# Patient Record
Sex: Female | Born: 1945 | Race: Black or African American | Hispanic: No | Marital: Married | Smoking: Former smoker
Health system: Southern US, Community
[De-identification: ages and names within clinical notes are randomized; demographics above are authoritative.]

## PROBLEM LIST (undated history)

## (undated) DIAGNOSIS — IMO0002 Reserved for concepts with insufficient information to code with codable children: Secondary | ICD-10-CM

## (undated) DIAGNOSIS — T7840XA Allergy, unspecified, initial encounter: Secondary | ICD-10-CM

## (undated) DIAGNOSIS — J439 Emphysema, unspecified: Secondary | ICD-10-CM

## (undated) DIAGNOSIS — K219 Gastro-esophageal reflux disease without esophagitis: Secondary | ICD-10-CM

## (undated) DIAGNOSIS — E119 Type 2 diabetes mellitus without complications: Secondary | ICD-10-CM

## (undated) DIAGNOSIS — C801 Malignant (primary) neoplasm, unspecified: Secondary | ICD-10-CM

## (undated) HISTORY — PX: LUNG SURGERY: SHX703

## (undated) HISTORY — DX: Allergy, unspecified, initial encounter: T78.40XA

## (undated) HISTORY — DX: Emphysema, unspecified: J43.9

## (undated) HISTORY — DX: Reserved for concepts with insufficient information to code with codable children: IMO0002

## (undated) HISTORY — DX: Type 2 diabetes mellitus without complications: E11.9

## (undated) HISTORY — DX: Malignant (primary) neoplasm, unspecified: C80.1

## (undated) HISTORY — DX: Gastro-esophageal reflux disease without esophagitis: K21.9

---

## 1998-08-27 ENCOUNTER — Other Ambulatory Visit: Admission: RE | Admit: 1998-08-27 | Discharge: 1998-08-27 | Payer: Self-pay | Admitting: Obstetrics and Gynecology

## 1999-10-04 ENCOUNTER — Other Ambulatory Visit: Admission: RE | Admit: 1999-10-04 | Discharge: 1999-10-04 | Payer: Self-pay | Admitting: Obstetrics and Gynecology

## 2000-09-16 ENCOUNTER — Emergency Department (HOSPITAL_COMMUNITY): Admission: EM | Admit: 2000-09-16 | Discharge: 2000-09-17 | Payer: Self-pay | Admitting: Emergency Medicine

## 2000-10-20 ENCOUNTER — Other Ambulatory Visit: Admission: RE | Admit: 2000-10-20 | Discharge: 2000-10-20 | Payer: Self-pay | Admitting: Obstetrics and Gynecology

## 2001-11-04 ENCOUNTER — Other Ambulatory Visit: Admission: RE | Admit: 2001-11-04 | Discharge: 2001-11-04 | Payer: Self-pay | Admitting: Obstetrics and Gynecology

## 2002-11-08 ENCOUNTER — Other Ambulatory Visit: Admission: RE | Admit: 2002-11-08 | Discharge: 2002-11-08 | Payer: Self-pay | Admitting: Obstetrics and Gynecology

## 2003-08-17 ENCOUNTER — Encounter: Admission: RE | Admit: 2003-08-17 | Discharge: 2003-08-17 | Payer: Self-pay | Admitting: Internal Medicine

## 2003-11-28 ENCOUNTER — Other Ambulatory Visit: Admission: RE | Admit: 2003-11-28 | Discharge: 2003-11-28 | Payer: Self-pay | Admitting: Obstetrics and Gynecology

## 2007-03-03 ENCOUNTER — Ambulatory Visit (HOSPITAL_COMMUNITY): Admission: RE | Admit: 2007-03-03 | Discharge: 2007-03-03 | Payer: Self-pay | Admitting: Internal Medicine

## 2007-10-04 ENCOUNTER — Encounter: Admission: RE | Admit: 2007-10-04 | Discharge: 2007-10-04 | Payer: Self-pay | Admitting: Internal Medicine

## 2007-10-12 ENCOUNTER — Ambulatory Visit (HOSPITAL_COMMUNITY): Admission: RE | Admit: 2007-10-12 | Discharge: 2007-10-12 | Payer: Self-pay | Admitting: Internal Medicine

## 2007-10-14 ENCOUNTER — Ambulatory Visit: Payer: Self-pay | Admitting: Thoracic Surgery

## 2007-10-18 ENCOUNTER — Ambulatory Visit (HOSPITAL_COMMUNITY): Admission: RE | Admit: 2007-10-18 | Discharge: 2007-10-18 | Payer: Self-pay | Admitting: Thoracic Surgery

## 2007-10-21 ENCOUNTER — Inpatient Hospital Stay (HOSPITAL_COMMUNITY): Admission: RE | Admit: 2007-10-21 | Discharge: 2007-10-28 | Payer: Self-pay | Admitting: Thoracic Surgery

## 2007-10-21 ENCOUNTER — Encounter: Payer: Self-pay | Admitting: Thoracic Surgery

## 2007-10-21 ENCOUNTER — Ambulatory Visit: Payer: Self-pay | Admitting: Internal Medicine

## 2007-10-22 ENCOUNTER — Ambulatory Visit: Payer: Self-pay | Admitting: Thoracic Surgery

## 2007-10-27 ENCOUNTER — Ambulatory Visit: Payer: Self-pay | Admitting: Internal Medicine

## 2007-11-03 ENCOUNTER — Encounter: Admission: RE | Admit: 2007-11-03 | Discharge: 2007-11-03 | Payer: Self-pay | Admitting: Thoracic Surgery

## 2007-11-03 ENCOUNTER — Ambulatory Visit: Payer: Self-pay | Admitting: Thoracic Surgery

## 2007-11-08 ENCOUNTER — Ambulatory Visit: Payer: Self-pay | Admitting: Thoracic Surgery

## 2007-11-10 LAB — CBC WITH DIFFERENTIAL/PLATELET
BASO%: 0.3 % (ref 0.0–2.0)
HCT: 34.9 % (ref 34.8–46.6)
MCHC: 34.1 g/dL (ref 32.0–36.0)
MONO#: 0.4 10*3/uL (ref 0.1–0.9)
RBC: 4.16 10*6/uL (ref 3.70–5.32)
RDW: 14.5 % (ref 11.3–14.5)
WBC: 7.5 10*3/uL (ref 3.9–10.0)
lymph#: 2 10*3/uL (ref 0.9–3.3)

## 2007-11-10 LAB — COMPREHENSIVE METABOLIC PANEL
ALT: 28 U/L (ref 0–35)
AST: 30 U/L (ref 0–37)
CO2: 25 mEq/L (ref 19–32)
Calcium: 9.4 mg/dL (ref 8.4–10.5)
Chloride: 99 mEq/L (ref 96–112)
Potassium: 4.1 mEq/L (ref 3.5–5.3)
Sodium: 140 mEq/L (ref 135–145)
Total Protein: 7.2 g/dL (ref 6.0–8.3)

## 2007-11-17 ENCOUNTER — Encounter: Admission: RE | Admit: 2007-11-17 | Discharge: 2007-11-17 | Payer: Self-pay | Admitting: Thoracic Surgery

## 2007-11-17 ENCOUNTER — Ambulatory Visit: Payer: Self-pay | Admitting: Thoracic Surgery

## 2007-11-18 LAB — CBC WITH DIFFERENTIAL/PLATELET
BASO%: 0.2 % (ref 0.0–2.0)
LYMPH%: 8.5 % — ABNORMAL LOW (ref 14.0–48.0)
MCHC: 32.6 g/dL (ref 32.0–36.0)
MONO#: 0.5 10*3/uL (ref 0.1–0.9)
RBC: 4.29 10*6/uL (ref 3.70–5.32)
RDW: 13.7 % (ref 11.3–14.5)
WBC: 16.3 10*3/uL — ABNORMAL HIGH (ref 3.9–10.0)
lymph#: 1.4 10*3/uL (ref 0.9–3.3)

## 2007-11-18 LAB — COMPREHENSIVE METABOLIC PANEL
ALT: 17 U/L (ref 0–35)
CO2: 22 mEq/L (ref 19–32)
Calcium: 10.3 mg/dL (ref 8.4–10.5)
Chloride: 99 mEq/L (ref 96–112)
Glucose, Bld: 242 mg/dL — ABNORMAL HIGH (ref 70–99)
Sodium: 138 mEq/L (ref 135–145)
Total Protein: 7.8 g/dL (ref 6.0–8.3)

## 2007-12-02 LAB — CBC WITH DIFFERENTIAL/PLATELET
Eosinophils Absolute: 0 10*3/uL (ref 0.0–0.5)
HCT: 31.5 % — ABNORMAL LOW (ref 34.8–46.6)
LYMPH%: 13.5 % — ABNORMAL LOW (ref 14.0–48.0)
MCHC: 34.1 g/dL (ref 32.0–36.0)
MONO#: 0.3 10*3/uL (ref 0.1–0.9)
NEUT#: 13.6 10*3/uL — ABNORMAL HIGH (ref 1.5–6.5)
NEUT%: 84.6 % — ABNORMAL HIGH (ref 39.6–76.8)
Platelets: 189 10*3/uL (ref 145–400)
WBC: 16.1 10*3/uL — ABNORMAL HIGH (ref 3.9–10.0)

## 2007-12-02 LAB — MAGNESIUM: Magnesium: 1.3 mg/dL — ABNORMAL LOW (ref 1.5–2.5)

## 2007-12-02 LAB — COMPREHENSIVE METABOLIC PANEL
CO2: 25 mEq/L (ref 19–32)
Creatinine, Ser: 1.18 mg/dL (ref 0.40–1.20)
Glucose, Bld: 271 mg/dL — ABNORMAL HIGH (ref 70–99)
Total Bilirubin: 0.3 mg/dL (ref 0.3–1.2)

## 2007-12-07 ENCOUNTER — Ambulatory Visit: Payer: Self-pay | Admitting: Internal Medicine

## 2007-12-08 ENCOUNTER — Encounter: Admission: RE | Admit: 2007-12-08 | Discharge: 2007-12-08 | Payer: Self-pay | Admitting: Thoracic Surgery

## 2007-12-08 ENCOUNTER — Ambulatory Visit: Payer: Self-pay | Admitting: Thoracic Surgery

## 2007-12-09 LAB — MAGNESIUM: Magnesium: 2.1 mg/dL (ref 1.5–2.5)

## 2007-12-09 LAB — COMPREHENSIVE METABOLIC PANEL
ALT: 13 U/L (ref 0–35)
Albumin: 3.6 g/dL (ref 3.5–5.2)
CO2: 15 mEq/L — ABNORMAL LOW (ref 19–32)
Calcium: 8.4 mg/dL (ref 8.4–10.5)
Chloride: 103 mEq/L (ref 96–112)
Creatinine, Ser: 1.16 mg/dL (ref 0.40–1.20)
Potassium: 4.5 mEq/L (ref 3.5–5.3)

## 2007-12-09 LAB — CBC WITH DIFFERENTIAL/PLATELET
BASO%: 0.2 % (ref 0.0–2.0)
Basophils Absolute: 0 10*3/uL (ref 0.0–0.1)
HCT: 32.6 % — ABNORMAL LOW (ref 34.8–46.6)
HGB: 11.1 g/dL — ABNORMAL LOW (ref 11.6–15.9)
MCHC: 34.1 g/dL (ref 32.0–36.0)
MONO#: 0.9 10*3/uL (ref 0.1–0.9)
NEUT%: 89.2 % — ABNORMAL HIGH (ref 39.6–76.8)
WBC: 23.6 10*3/uL — ABNORMAL HIGH (ref 3.9–10.0)
lymph#: 1.6 10*3/uL (ref 0.9–3.3)

## 2007-12-16 LAB — CBC WITH DIFFERENTIAL/PLATELET
Eosinophils Absolute: 0.1 10*3/uL (ref 0.0–0.5)
HCT: 31.8 % — ABNORMAL LOW (ref 34.8–46.6)
LYMPH%: 40.6 % (ref 14.0–48.0)
MONO#: 0.5 10*3/uL (ref 0.1–0.9)
NEUT#: 2 10*3/uL (ref 1.5–6.5)
Platelets: 117 10*3/uL — ABNORMAL LOW (ref 145–400)
RBC: 3.82 10*6/uL (ref 3.70–5.32)
WBC: 4.4 10*3/uL (ref 3.9–10.0)

## 2007-12-16 LAB — COMPREHENSIVE METABOLIC PANEL
Albumin: 3.8 g/dL (ref 3.5–5.2)
CO2: 24 mEq/L (ref 19–32)
Calcium: 9.1 mg/dL (ref 8.4–10.5)
Glucose, Bld: 175 mg/dL — ABNORMAL HIGH (ref 70–99)
Sodium: 138 mEq/L (ref 135–145)
Total Bilirubin: 0.5 mg/dL (ref 0.3–1.2)
Total Protein: 6.4 g/dL (ref 6.0–8.3)

## 2007-12-16 LAB — MAGNESIUM: Magnesium: 1.3 mg/dL — ABNORMAL LOW (ref 1.5–2.5)

## 2007-12-23 LAB — COMPREHENSIVE METABOLIC PANEL
ALT: 15 U/L (ref 0–35)
AST: 17 U/L (ref 0–37)
BUN: 13 mg/dL (ref 6–23)
CO2: 22 mEq/L (ref 19–32)
Creatinine, Ser: 1.13 mg/dL (ref 0.40–1.20)
Total Bilirubin: 0.3 mg/dL (ref 0.3–1.2)

## 2007-12-23 LAB — CBC WITH DIFFERENTIAL/PLATELET
BASO%: 0.2 % (ref 0.0–2.0)
EOS%: 0 % (ref 0.0–7.0)
HCT: 29.6 % — ABNORMAL LOW (ref 34.8–46.6)
LYMPH%: 19 % (ref 14.0–48.0)
MCH: 29 pg (ref 26.0–34.0)
MCHC: 34.1 g/dL (ref 32.0–36.0)
MCV: 85 fL (ref 81.0–101.0)
NEUT%: 78.6 % — ABNORMAL HIGH (ref 39.6–76.8)
Platelets: 147 10*3/uL (ref 145–400)

## 2007-12-23 LAB — MAGNESIUM: Magnesium: 1.4 mg/dL — ABNORMAL LOW (ref 1.5–2.5)

## 2007-12-30 LAB — CBC WITH DIFFERENTIAL/PLATELET
Basophils Absolute: 0.1 10*3/uL (ref 0.0–0.1)
EOS%: 0 % (ref 0.0–7.0)
Eosinophils Absolute: 0 10*3/uL (ref 0.0–0.5)
HCT: 30.4 % — ABNORMAL LOW (ref 34.8–46.6)
HGB: 9.9 g/dL — ABNORMAL LOW (ref 11.6–15.9)
MCH: 28.7 pg (ref 26.0–34.0)
NEUT#: 17.3 10*3/uL — ABNORMAL HIGH (ref 1.5–6.5)
NEUT%: 91.7 % — ABNORMAL HIGH (ref 39.6–76.8)
lymph#: 1 10*3/uL (ref 0.9–3.3)

## 2007-12-30 LAB — COMPREHENSIVE METABOLIC PANEL
AST: 12 U/L (ref 0–37)
Albumin: 4 g/dL (ref 3.5–5.2)
BUN: 19 mg/dL (ref 6–23)
CO2: 21 mEq/L (ref 19–32)
Calcium: 9.3 mg/dL (ref 8.4–10.5)
Chloride: 100 mEq/L (ref 96–112)
Creatinine, Ser: 1.19 mg/dL (ref 0.40–1.20)
Glucose, Bld: 388 mg/dL — ABNORMAL HIGH (ref 70–99)
Potassium: 4.6 mEq/L (ref 3.5–5.3)

## 2008-01-06 LAB — COMPREHENSIVE METABOLIC PANEL
ALT: 14 U/L (ref 0–35)
AST: 13 U/L (ref 0–37)
Alkaline Phosphatase: 114 U/L (ref 39–117)
Sodium: 136 mEq/L (ref 135–145)
Total Bilirubin: 0.6 mg/dL (ref 0.3–1.2)
Total Protein: 6.4 g/dL (ref 6.0–8.3)

## 2008-01-06 LAB — CBC WITH DIFFERENTIAL/PLATELET
BASO%: 0.3 % (ref 0.0–2.0)
LYMPH%: 52.6 % — ABNORMAL HIGH (ref 14.0–48.0)
MCHC: 34.5 g/dL (ref 32.0–36.0)
MCV: 85.3 fL (ref 81.0–101.0)
MONO%: 8.6 % (ref 0.0–13.0)
Platelets: 117 10*3/uL — ABNORMAL LOW (ref 145–400)
RBC: 3.52 10*6/uL — ABNORMAL LOW (ref 3.70–5.32)
RDW: 17.3 % — ABNORMAL HIGH (ref 11.3–14.5)
WBC: 4.2 10*3/uL (ref 3.9–10.0)

## 2008-01-18 ENCOUNTER — Ambulatory Visit: Payer: Self-pay | Admitting: Internal Medicine

## 2008-01-19 ENCOUNTER — Ambulatory Visit: Payer: Self-pay | Admitting: Thoracic Surgery

## 2008-01-19 ENCOUNTER — Encounter: Admission: RE | Admit: 2008-01-19 | Discharge: 2008-01-19 | Payer: Self-pay | Admitting: Thoracic Surgery

## 2008-01-20 ENCOUNTER — Encounter (HOSPITAL_COMMUNITY): Admission: RE | Admit: 2008-01-20 | Discharge: 2008-04-10 | Payer: Self-pay | Admitting: Internal Medicine

## 2008-01-20 LAB — COMPREHENSIVE METABOLIC PANEL
ALT: 15 U/L (ref 0–35)
BUN: 20 mg/dL (ref 6–23)
CO2: 21 mEq/L (ref 19–32)
Calcium: 9 mg/dL (ref 8.4–10.5)
Chloride: 103 mEq/L (ref 96–112)
Creatinine, Ser: 1.62 mg/dL — ABNORMAL HIGH (ref 0.40–1.20)
Glucose, Bld: 337 mg/dL — ABNORMAL HIGH (ref 70–99)

## 2008-01-20 LAB — CBC WITH DIFFERENTIAL/PLATELET
BASO%: 0.1 % (ref 0.0–2.0)
HCT: 25.3 % — ABNORMAL LOW (ref 34.8–46.6)
MCHC: 33.3 g/dL (ref 32.0–36.0)
MONO#: 0.2 10*3/uL (ref 0.1–0.9)
NEUT%: 92.7 % — ABNORMAL HIGH (ref 39.6–76.8)
WBC: 16.5 10*3/uL — ABNORMAL HIGH (ref 3.9–10.0)
lymph#: 0.9 10*3/uL (ref 0.9–3.3)

## 2008-01-26 ENCOUNTER — Emergency Department (HOSPITAL_COMMUNITY): Admission: EM | Admit: 2008-01-26 | Discharge: 2008-01-26 | Payer: Self-pay | Admitting: Emergency Medicine

## 2008-01-27 LAB — CBC WITH DIFFERENTIAL/PLATELET
Basophils Absolute: 0 10*3/uL (ref 0.0–0.1)
Eosinophils Absolute: 0 10*3/uL (ref 0.0–0.5)
HCT: 38.2 % (ref 34.8–46.6)
HGB: 12.9 g/dL (ref 11.6–15.9)
MCH: 29.2 pg (ref 26.0–34.0)
MONO#: 0.5 10*3/uL (ref 0.1–0.9)
NEUT%: 72.5 % (ref 39.6–76.8)
WBC: 9.4 10*3/uL (ref 3.9–10.0)
lymph#: 2 10*3/uL (ref 0.9–3.3)

## 2008-01-27 LAB — COMPREHENSIVE METABOLIC PANEL
BUN: 32 mg/dL — ABNORMAL HIGH (ref 6–23)
CO2: 23 mEq/L (ref 19–32)
Calcium: 9.5 mg/dL (ref 8.4–10.5)
Chloride: 101 mEq/L (ref 96–112)
Creatinine, Ser: 1.45 mg/dL — ABNORMAL HIGH (ref 0.40–1.20)
Glucose, Bld: 98 mg/dL (ref 70–99)

## 2008-01-27 LAB — MAGNESIUM: Magnesium: 1.1 mg/dL — ABNORMAL LOW (ref 1.5–2.5)

## 2008-02-03 LAB — CBC WITH DIFFERENTIAL/PLATELET
Basophils Absolute: 0.1 10*3/uL (ref 0.0–0.1)
Eosinophils Absolute: 0 10*3/uL (ref 0.0–0.5)
HGB: 11.6 g/dL (ref 11.6–15.9)
MCV: 86.4 fL (ref 81.0–101.0)
MONO#: 0.3 10*3/uL (ref 0.1–0.9)
MONO%: 1.5 % (ref 0.0–13.0)
NEUT#: 13.9 10*3/uL — ABNORMAL HIGH (ref 1.5–6.5)
RBC: 3.96 10*6/uL (ref 3.70–5.32)
RDW: 17.8 % — ABNORMAL HIGH (ref 11.3–14.5)
WBC: 18.1 10*3/uL — ABNORMAL HIGH (ref 3.9–10.0)

## 2008-02-03 LAB — COMPREHENSIVE METABOLIC PANEL
Albumin: 3.5 g/dL (ref 3.5–5.2)
Alkaline Phosphatase: 143 U/L — ABNORMAL HIGH (ref 39–117)
BUN: 18 mg/dL (ref 6–23)
CO2: 27 mEq/L (ref 19–32)
Calcium: 8.9 mg/dL (ref 8.4–10.5)
Chloride: 104 mEq/L (ref 96–112)
Glucose, Bld: 161 mg/dL — ABNORMAL HIGH (ref 70–99)
Potassium: 4.1 mEq/L (ref 3.5–5.3)
Sodium: 140 mEq/L (ref 135–145)
Total Protein: 6 g/dL (ref 6.0–8.3)

## 2008-02-03 LAB — MAGNESIUM: Magnesium: 1.1 mg/dL — ABNORMAL LOW (ref 1.5–2.5)

## 2008-02-08 ENCOUNTER — Ambulatory Visit (HOSPITAL_COMMUNITY): Admission: RE | Admit: 2008-02-08 | Discharge: 2008-02-08 | Payer: Self-pay | Admitting: Internal Medicine

## 2008-02-10 LAB — CBC WITH DIFFERENTIAL/PLATELET
BASO%: 0.7 % (ref 0.0–2.0)
Eosinophils Absolute: 0 10*3/uL (ref 0.0–0.5)
LYMPH%: 24.8 % (ref 14.0–48.0)
MCHC: 33 g/dL (ref 32.0–36.0)
MONO#: 0.8 10*3/uL (ref 0.1–0.9)
NEUT#: 8.1 10*3/uL — ABNORMAL HIGH (ref 1.5–6.5)
Platelets: 171 10*3/uL (ref 145–400)
RBC: 3.57 10*6/uL — ABNORMAL LOW (ref 3.70–5.32)
RDW: 17.1 % — ABNORMAL HIGH (ref 11.3–14.5)
WBC: 11.9 10*3/uL — ABNORMAL HIGH (ref 3.9–10.0)

## 2008-02-10 LAB — COMPREHENSIVE METABOLIC PANEL
ALT: 15 U/L (ref 0–35)
Albumin: 3.6 g/dL (ref 3.5–5.2)
Alkaline Phosphatase: 143 U/L — ABNORMAL HIGH (ref 39–117)
CO2: 24 mEq/L (ref 19–32)
Potassium: 3.7 mEq/L (ref 3.5–5.3)
Sodium: 139 mEq/L (ref 135–145)
Total Bilirubin: 0.3 mg/dL (ref 0.3–1.2)
Total Protein: 6.1 g/dL (ref 6.0–8.3)

## 2008-05-01 ENCOUNTER — Ambulatory Visit: Payer: Self-pay | Admitting: Internal Medicine

## 2008-05-04 ENCOUNTER — Ambulatory Visit (HOSPITAL_COMMUNITY): Admission: RE | Admit: 2008-05-04 | Discharge: 2008-05-04 | Payer: Self-pay | Admitting: Internal Medicine

## 2008-05-11 LAB — CBC WITH DIFFERENTIAL/PLATELET
Basophils Absolute: 0 10*3/uL (ref 0.0–0.1)
EOS%: 3.9 % (ref 0.0–7.0)
Eosinophils Absolute: 0.3 10*3/uL (ref 0.0–0.5)
HCT: 34.8 % (ref 34.8–46.6)
HGB: 11.9 g/dL (ref 11.6–15.9)
MCH: 30.3 pg (ref 26.0–34.0)
MCV: 88.8 fL (ref 81.0–101.0)
MONO%: 6.6 % (ref 0.0–13.0)
NEUT#: 4.3 10*3/uL (ref 1.5–6.5)
NEUT%: 53.3 % (ref 39.6–76.8)

## 2008-05-11 LAB — COMPREHENSIVE METABOLIC PANEL
AST: 21 U/L (ref 0–37)
Albumin: 3.8 g/dL (ref 3.5–5.2)
Alkaline Phosphatase: 141 U/L — ABNORMAL HIGH (ref 39–117)
BUN: 35 mg/dL — ABNORMAL HIGH (ref 6–23)
Calcium: 9.6 mg/dL (ref 8.4–10.5)
Chloride: 101 mEq/L (ref 96–112)
Creatinine, Ser: 1.56 mg/dL — ABNORMAL HIGH (ref 0.40–1.20)
Glucose, Bld: 134 mg/dL — ABNORMAL HIGH (ref 70–99)

## 2008-08-03 ENCOUNTER — Ambulatory Visit: Payer: Self-pay | Admitting: Internal Medicine

## 2008-08-07 ENCOUNTER — Ambulatory Visit (HOSPITAL_COMMUNITY): Admission: RE | Admit: 2008-08-07 | Discharge: 2008-08-07 | Payer: Self-pay | Admitting: Internal Medicine

## 2008-10-31 ENCOUNTER — Ambulatory Visit: Payer: Self-pay | Admitting: Internal Medicine

## 2008-11-02 ENCOUNTER — Ambulatory Visit (HOSPITAL_COMMUNITY): Admission: RE | Admit: 2008-11-02 | Discharge: 2008-11-02 | Payer: Self-pay | Admitting: Internal Medicine

## 2008-11-02 LAB — CBC WITH DIFFERENTIAL/PLATELET
Basophils Absolute: 0 10*3/uL (ref 0.0–0.1)
Eosinophils Absolute: 0.3 10*3/uL (ref 0.0–0.5)
HCT: 32.2 % — ABNORMAL LOW (ref 34.8–46.6)
HGB: 10.8 g/dL — ABNORMAL LOW (ref 11.6–15.9)
MONO#: 0.5 10*3/uL (ref 0.1–0.9)
NEUT#: 4 10*3/uL (ref 1.5–6.5)
NEUT%: 57 % (ref 38.4–76.8)
RDW: 15.1 % — ABNORMAL HIGH (ref 11.2–14.5)
WBC: 6.9 10*3/uL (ref 3.9–10.3)
lymph#: 2.2 10*3/uL (ref 0.9–3.3)

## 2008-11-02 LAB — COMPREHENSIVE METABOLIC PANEL
AST: 23 U/L (ref 0–37)
BUN: 17 mg/dL (ref 6–23)
CO2: 29 mEq/L (ref 19–32)
Calcium: 9 mg/dL (ref 8.4–10.5)
Chloride: 103 mEq/L (ref 96–112)
Creatinine, Ser: 1.47 mg/dL — ABNORMAL HIGH (ref 0.40–1.20)
Glucose, Bld: 111 mg/dL — ABNORMAL HIGH (ref 70–99)

## 2009-01-09 IMAGING — CR DG CHEST 1V PORT
1 series · 1 of 1 positions shown · non-contrast
Comparison: [DATE] and 10/24/07.

CLINICAL DATA: Chest tube removal in a patient with left upper lobe mass.
 PORTABLE CHEST - 1 VIEW:

[view not recorded]
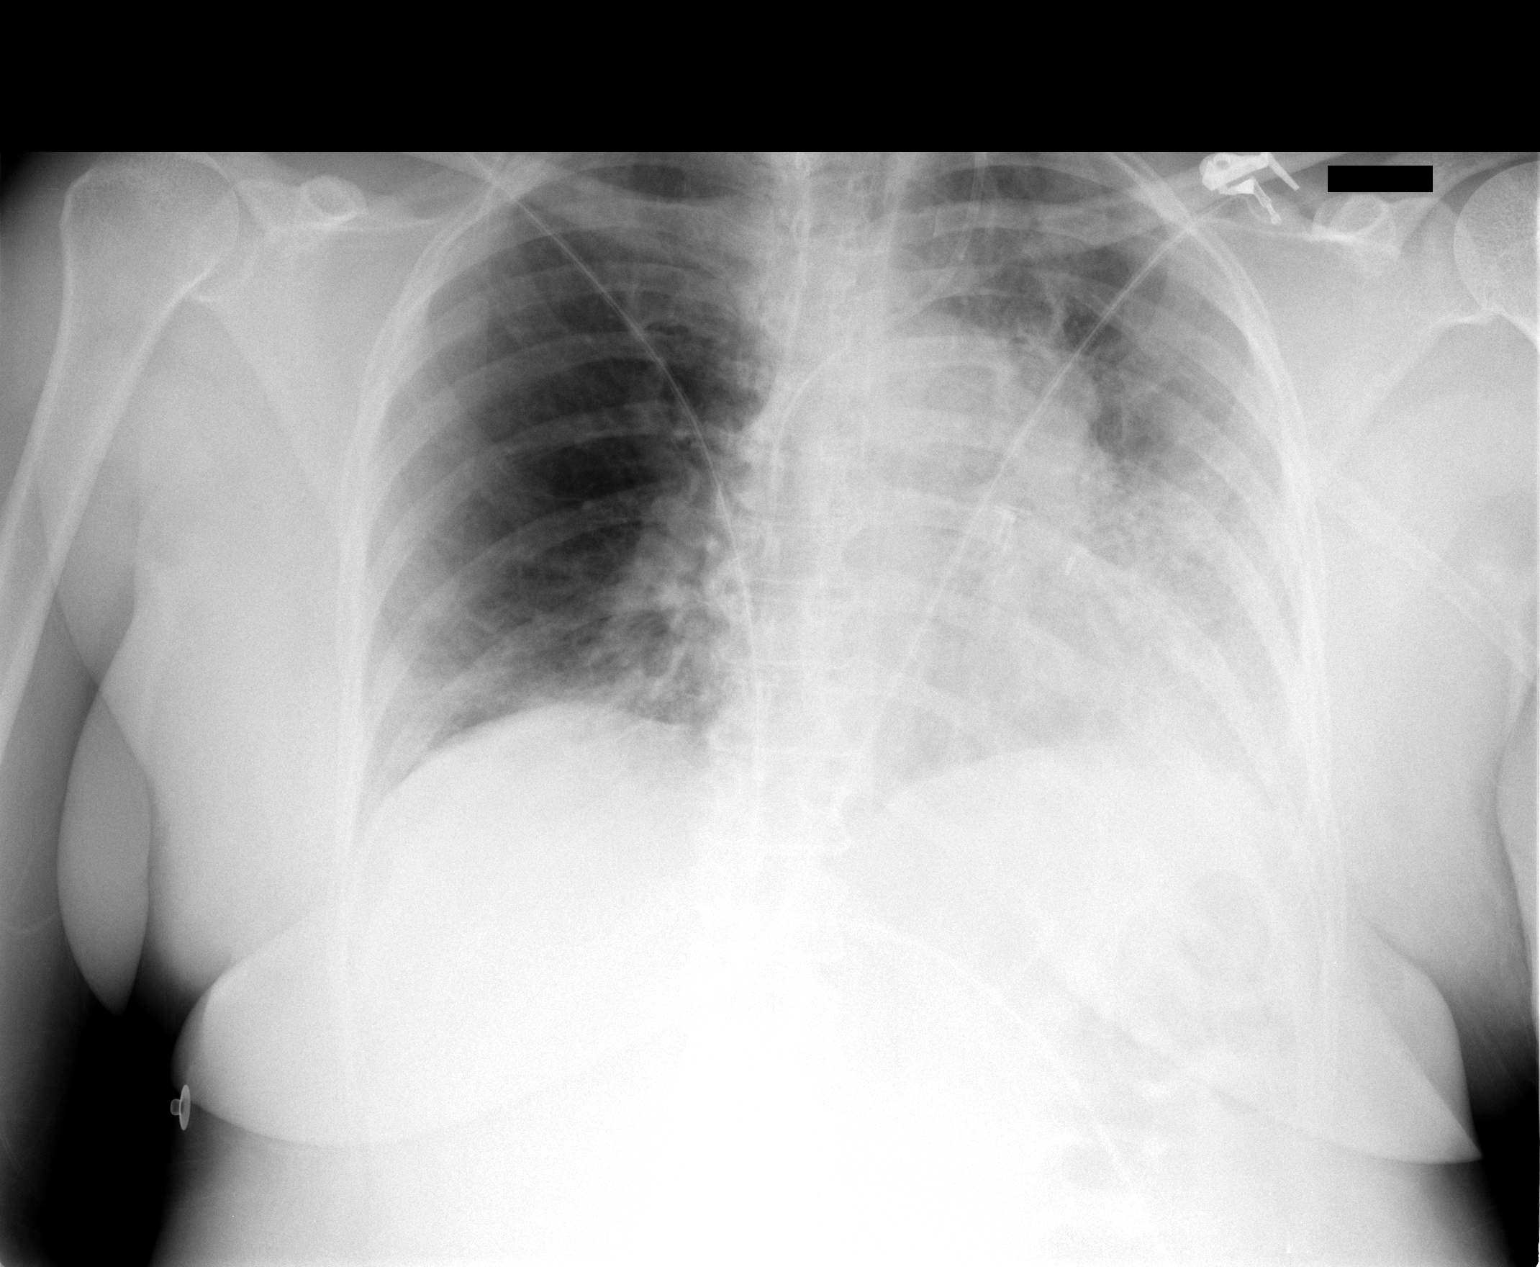

[1 of 1 positions shown; findings below may reference images not displayed]

FINDINGS: Patient?s left chest tube has been removed.  Tiny pneumothorax in the periphery of the left chest is smaller on this examination.  Pleural and parenchymal opacities to the left lung are unchanged.  Right basilar atelectasis again noted.  Heart size is normal.
IMPRESSION: Interval decrease in the size of a tiny left pneumothorax after chest tube removal.  No other change.

## 2009-01-10 IMAGING — CR DG CHEST 2V
2 series · 2 of 2 positions shown · non-contrast
Comparison: none

CLINICAL DATA: Post lung surgery.  Left upper lobe mass.  Shortness of breath.  Tiny pneumothorax. 
 CHEST ? 2 VIEW:

[w chest pa]
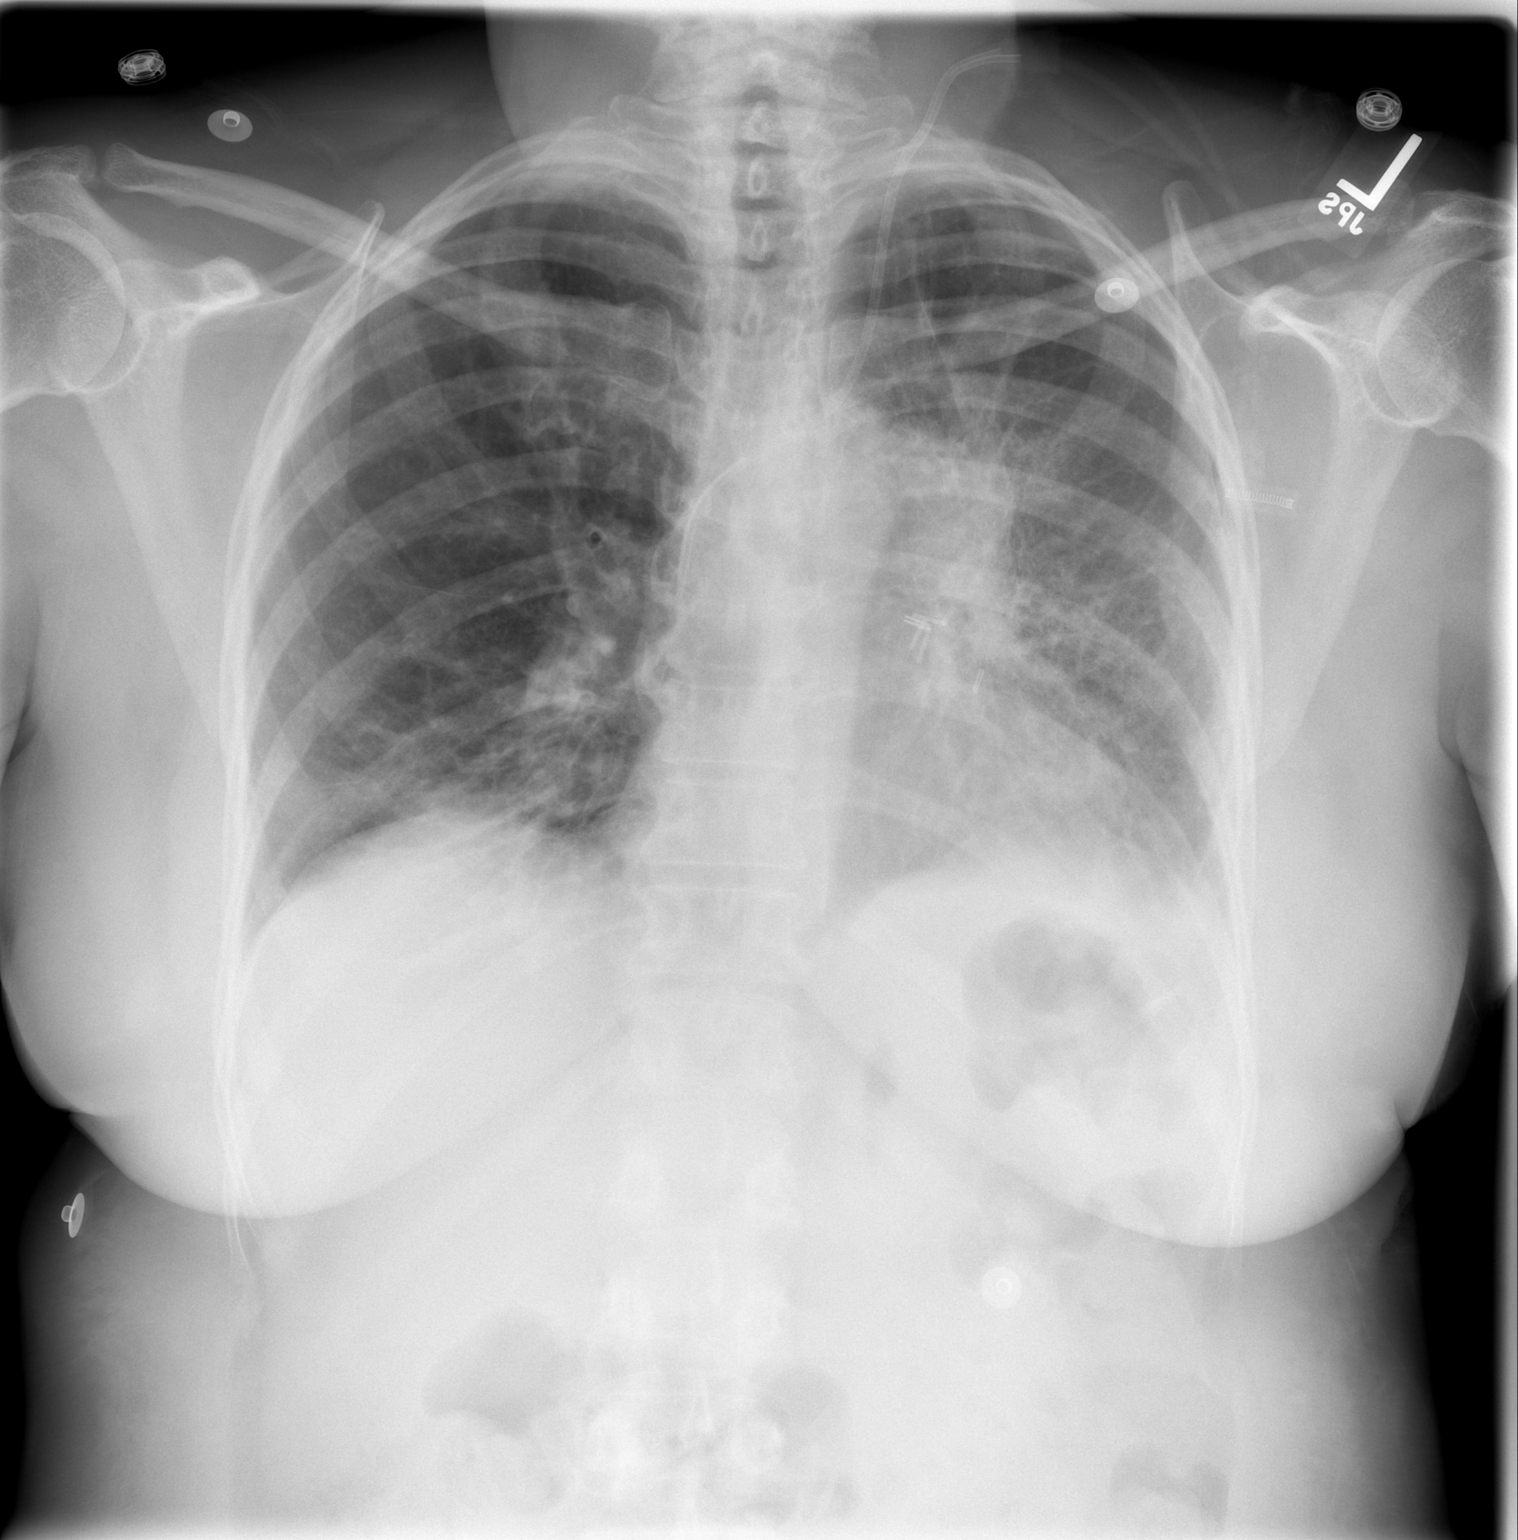

[w chest lat]
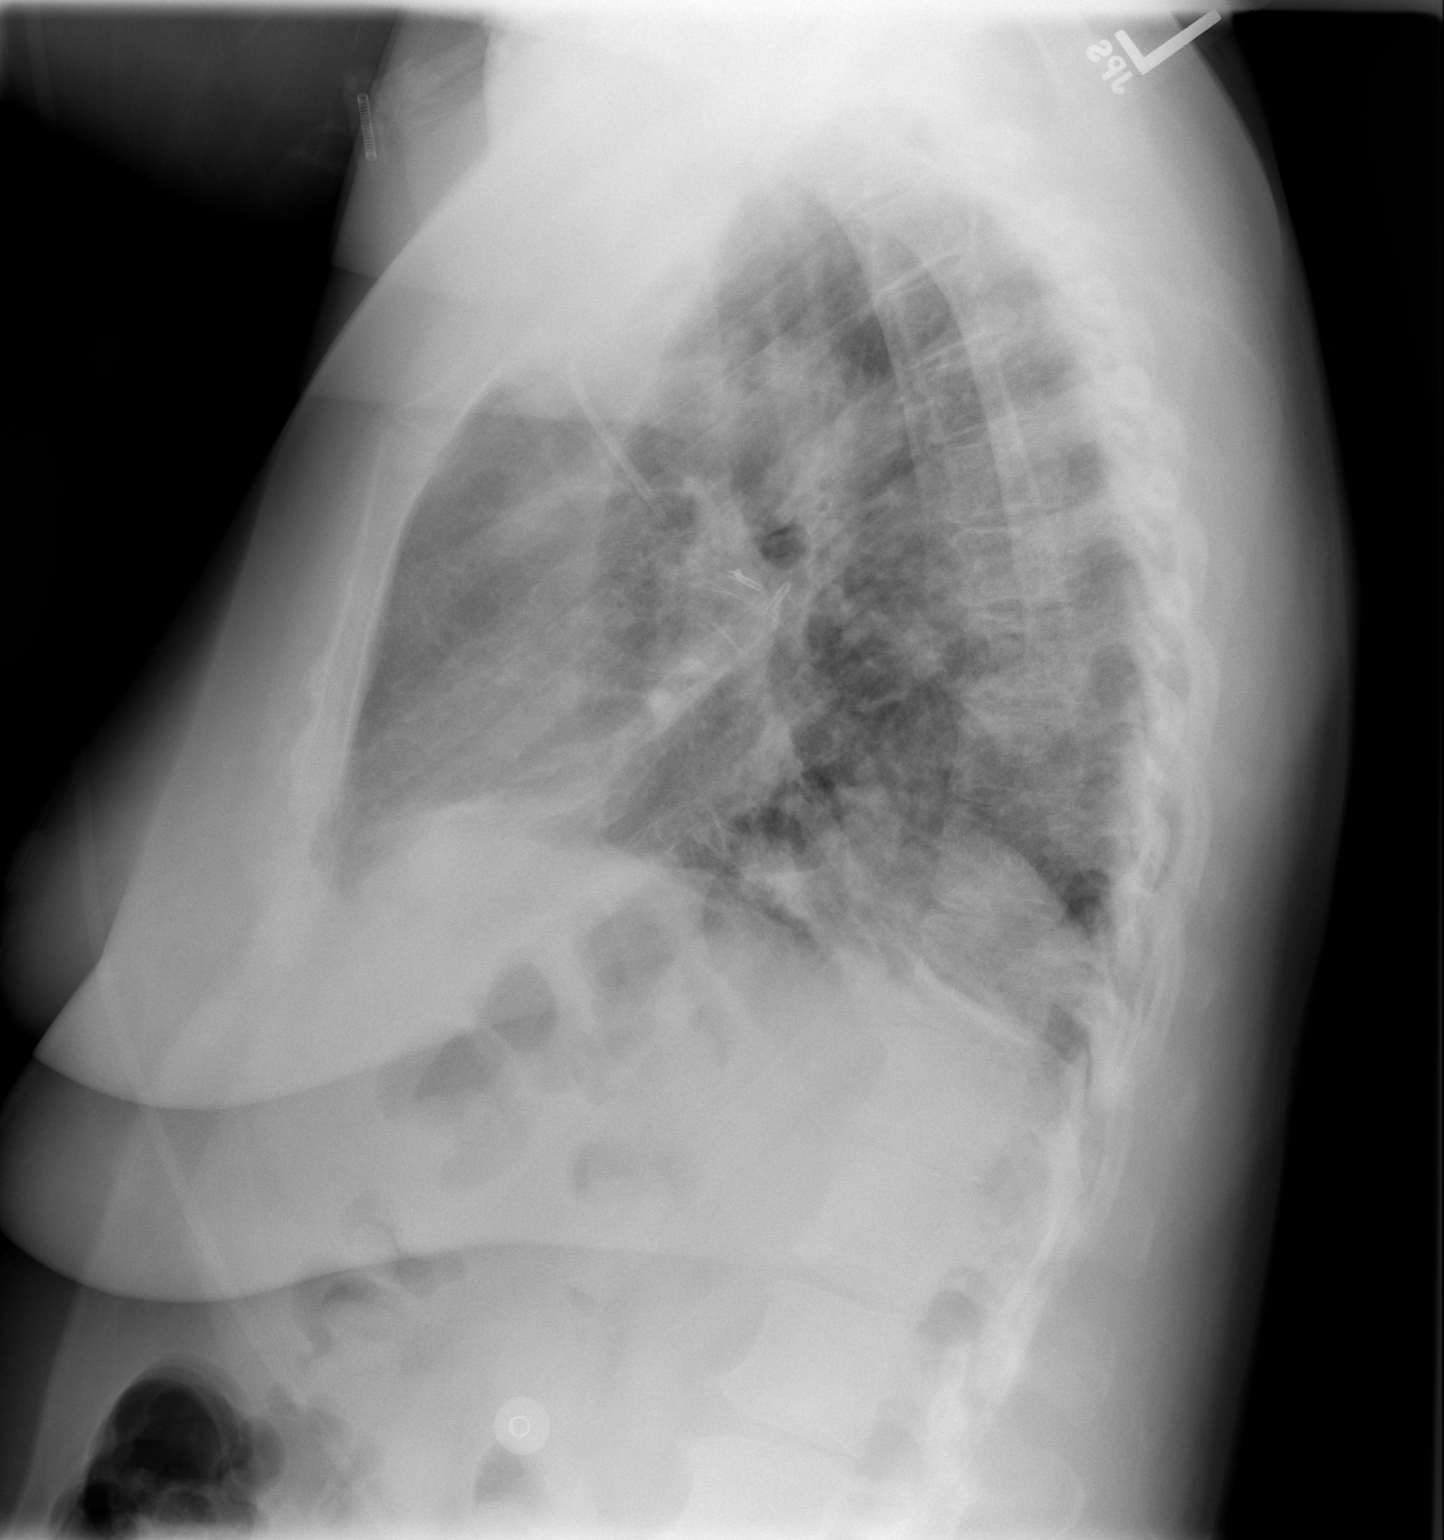

[2 of 2 positions shown; findings below may reference images not displayed]

FINDINGS: No change in tiny left upper lobe residual pneumothorax.  Submaximal inspiration, slightly greater aeration of left lower lobe with persistent left perihilar and bibasilar atelectasis noted.  Left internal jugular central venous catheter ends in superior vena cava.  Surgical clips at the left hilar region are stable.
IMPRESSION: 1.   Lesser inspiration with slightly greater aeration of left lower lobe. 
 2.  Stable tiny lateral left upper lobe pneumothorax.
 3.  Otherwise, no significant change.

## 2009-01-11 IMAGING — CR DG CHEST 2V
2 series · 2 of 2 positions shown · non-contrast
Comparison: Chest single view of 10/26/07.

CLINICAL DATA: Left upper lobe mass. 
CHEST ? 2 VIEW:

[w chest pa]
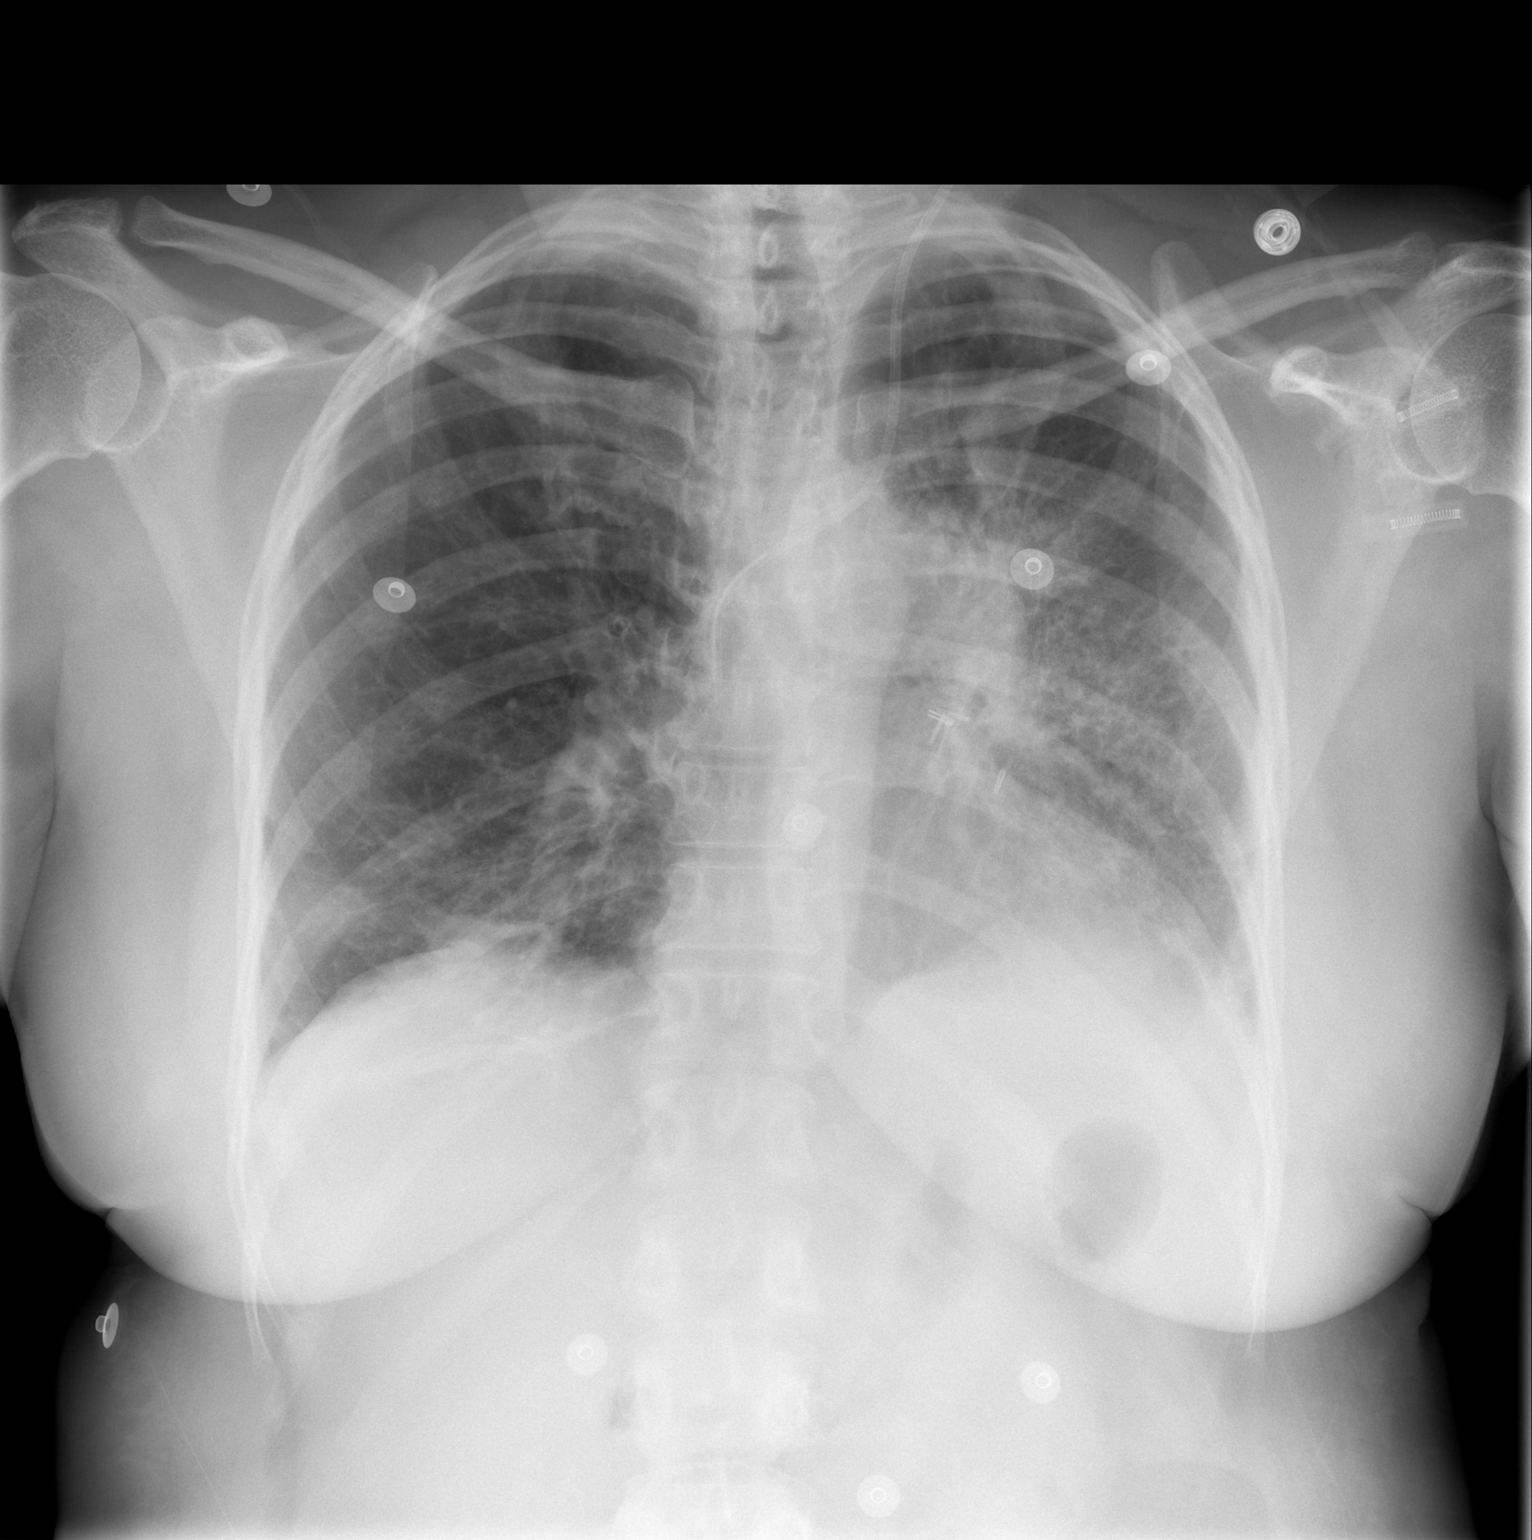

[w chest lat]
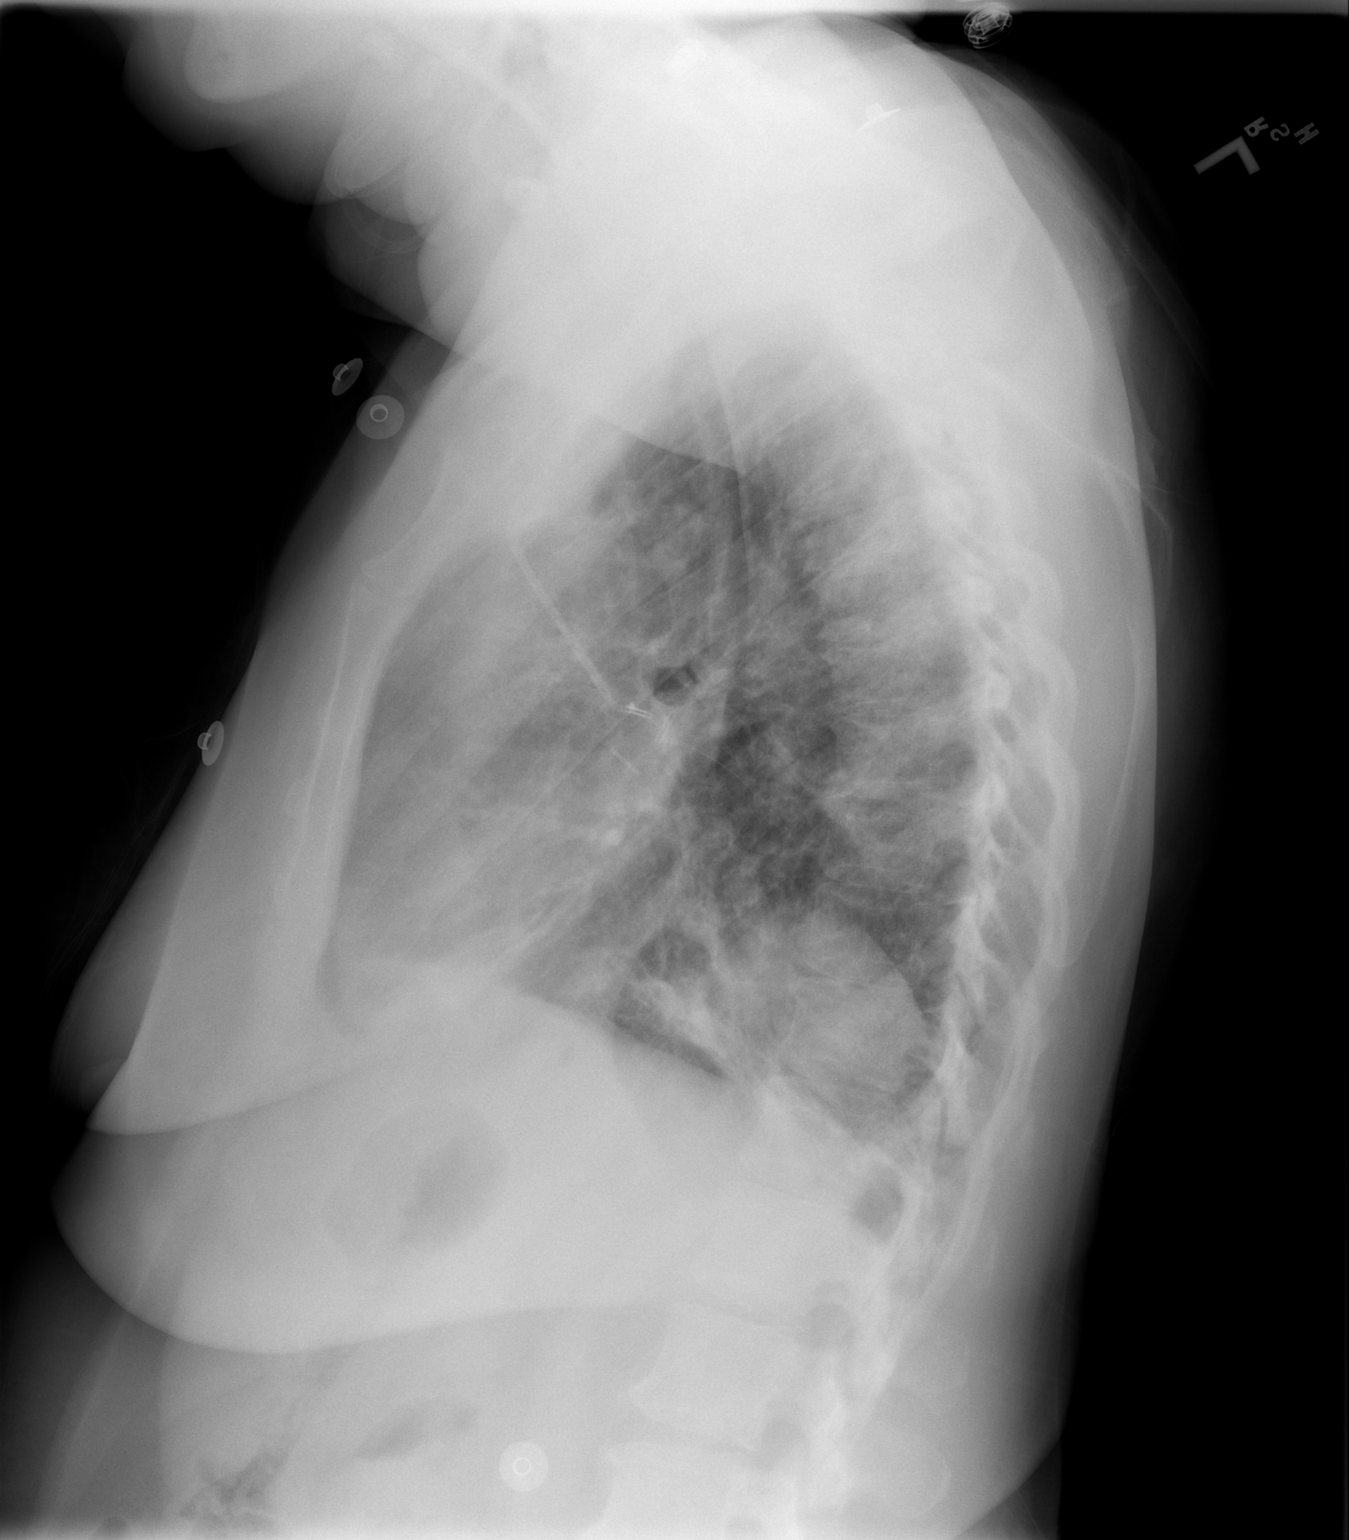

[2 of 2 positions shown; findings below may reference images not displayed]

FINDINGS: Left central venous line unchanged.  Normal cardiac silhouette.  Small left pneumothorax is not apparent on today?s film.  There is left pleural effusion.  There is slight asymmetric airspace disease in the left mid lung.     Stable left lower lobe atelectasis and effusion.
IMPRESSION: 1. Small left pneumothorax not  visible.
2. Asymmetric airspace disease could represent pneumonia or edema.

## 2009-03-09 ENCOUNTER — Ambulatory Visit: Payer: Self-pay | Admitting: Internal Medicine

## 2009-03-13 ENCOUNTER — Ambulatory Visit (HOSPITAL_COMMUNITY): Admission: RE | Admit: 2009-03-13 | Discharge: 2009-03-13 | Payer: Self-pay | Admitting: Internal Medicine

## 2009-03-13 LAB — CBC WITH DIFFERENTIAL/PLATELET
Basophils Absolute: 0 10*3/uL (ref 0.0–0.1)
EOS%: 4 % (ref 0.0–7.0)
MCH: 29.9 pg (ref 25.1–34.0)
MCV: 88.3 fL (ref 79.5–101.0)
MONO%: 6 % (ref 0.0–14.0)
RBC: 3.73 10*6/uL (ref 3.70–5.45)
RDW: 14.7 % — ABNORMAL HIGH (ref 11.2–14.5)

## 2009-03-13 LAB — COMPREHENSIVE METABOLIC PANEL
AST: 26 U/L (ref 0–37)
Albumin: 3.8 g/dL (ref 3.5–5.2)
Alkaline Phosphatase: 117 U/L (ref 39–117)
BUN: 21 mg/dL (ref 6–23)
Potassium: 4.1 mEq/L (ref 3.5–5.3)
Sodium: 139 mEq/L (ref 135–145)
Total Bilirubin: 0.5 mg/dL (ref 0.3–1.2)

## 2009-07-17 ENCOUNTER — Ambulatory Visit: Payer: Self-pay | Admitting: Internal Medicine

## 2009-07-19 ENCOUNTER — Ambulatory Visit (HOSPITAL_COMMUNITY): Admission: RE | Admit: 2009-07-19 | Discharge: 2009-07-19 | Payer: Self-pay | Admitting: Internal Medicine

## 2009-07-19 LAB — CBC WITH DIFFERENTIAL/PLATELET
EOS%: 3.6 % (ref 0.0–7.0)
MCH: 30.6 pg (ref 25.1–34.0)
MCV: 90.7 fL (ref 79.5–101.0)
MONO%: 5.1 % (ref 0.0–14.0)
NEUT#: 3.7 10*3/uL (ref 1.5–6.5)
RBC: 3.73 10*6/uL (ref 3.70–5.45)
RDW: 14.3 % (ref 11.2–14.5)

## 2009-07-19 LAB — COMPREHENSIVE METABOLIC PANEL
AST: 25 U/L (ref 0–37)
Albumin: 3.9 g/dL (ref 3.5–5.2)
Alkaline Phosphatase: 130 U/L — ABNORMAL HIGH (ref 39–117)
Potassium: 4 mEq/L (ref 3.5–5.3)
Sodium: 138 mEq/L (ref 135–145)
Total Protein: 7.1 g/dL (ref 6.0–8.3)

## 2009-11-09 ENCOUNTER — Ambulatory Visit: Payer: Self-pay | Admitting: Internal Medicine

## 2009-11-13 ENCOUNTER — Ambulatory Visit (HOSPITAL_COMMUNITY): Admission: RE | Admit: 2009-11-13 | Discharge: 2009-11-13 | Payer: Self-pay | Admitting: Internal Medicine

## 2009-11-13 LAB — CBC WITH DIFFERENTIAL/PLATELET
BASO%: 0.5 % (ref 0.0–2.0)
EOS%: 4 % (ref 0.0–7.0)
HCT: 36.1 % (ref 34.8–46.6)
MCH: 30.1 pg (ref 25.1–34.0)
MCHC: 33.9 g/dL (ref 31.5–36.0)
MONO#: 0.4 10*3/uL (ref 0.1–0.9)
NEUT%: 54.6 % (ref 38.4–76.8)
RDW: 14.5 % (ref 11.2–14.5)
WBC: 8.2 10*3/uL (ref 3.9–10.3)
lymph#: 2.9 10*3/uL (ref 0.9–3.3)

## 2009-11-13 LAB — COMPREHENSIVE METABOLIC PANEL
ALT: 18 U/L (ref 0–35)
AST: 26 U/L (ref 0–37)
Albumin: 4 g/dL (ref 3.5–5.2)
CO2: 29 mEq/L (ref 19–32)
Calcium: 9.5 mg/dL (ref 8.4–10.5)
Chloride: 103 mEq/L (ref 96–112)
Creatinine, Ser: 1.54 mg/dL — ABNORMAL HIGH (ref 0.40–1.20)
Potassium: 4.2 mEq/L (ref 3.5–5.3)
Sodium: 140 mEq/L (ref 135–145)
Total Protein: 7.3 g/dL (ref 6.0–8.3)

## 2010-05-15 ENCOUNTER — Ambulatory Visit: Payer: Self-pay | Admitting: Internal Medicine

## 2010-05-20 ENCOUNTER — Ambulatory Visit (HOSPITAL_COMMUNITY): Admission: RE | Admit: 2010-05-20 | Discharge: 2010-05-20 | Payer: Self-pay | Admitting: Internal Medicine

## 2010-09-20 ENCOUNTER — Other Ambulatory Visit: Payer: Self-pay | Admitting: Internal Medicine

## 2010-09-20 DIAGNOSIS — C3490 Malignant neoplasm of unspecified part of unspecified bronchus or lung: Secondary | ICD-10-CM

## 2010-11-18 ENCOUNTER — Other Ambulatory Visit: Payer: Self-pay | Admitting: Internal Medicine

## 2010-11-18 ENCOUNTER — Ambulatory Visit (HOSPITAL_COMMUNITY)
Admission: RE | Admit: 2010-11-18 | Discharge: 2010-11-18 | Disposition: A | Discharge status: Home / Self Care | Payer: 59 | Source: Ambulatory Visit | Attending: Internal Medicine | Admitting: Internal Medicine

## 2010-11-18 DIAGNOSIS — C3490 Malignant neoplasm of unspecified part of unspecified bronchus or lung: Secondary | ICD-10-CM

## 2010-11-18 DIAGNOSIS — K449 Diaphragmatic hernia without obstruction or gangrene: Secondary | ICD-10-CM | POA: Insufficient documentation

## 2010-11-18 DIAGNOSIS — E279 Disorder of adrenal gland, unspecified: Secondary | ICD-10-CM | POA: Insufficient documentation

## 2010-11-20 ENCOUNTER — Encounter (HOSPITAL_BASED_OUTPATIENT_CLINIC_OR_DEPARTMENT_OTHER): Payer: 59 | Admitting: Internal Medicine

## 2010-11-20 ENCOUNTER — Other Ambulatory Visit: Payer: Self-pay | Admitting: Internal Medicine

## 2010-11-20 DIAGNOSIS — E279 Disorder of adrenal gland, unspecified: Secondary | ICD-10-CM

## 2010-11-20 DIAGNOSIS — J438 Other emphysema: Secondary | ICD-10-CM

## 2010-11-20 DIAGNOSIS — C3490 Malignant neoplasm of unspecified part of unspecified bronchus or lung: Secondary | ICD-10-CM

## 2010-11-20 DIAGNOSIS — N289 Disorder of kidney and ureter, unspecified: Secondary | ICD-10-CM

## 2011-01-14 NOTE — H&P (Signed)
NAME:  Kelly Herman, Kelly Herman NO.:  [id number]   MEDICAL RECORD NO.:  [id number]          PATIENT TYPE:  INP   LOCATION:  NA                           FACILITY:  [facility]   PHYSICIAN:  Ines Bloomer, M.D. DATE OF BIRTH:  Jan 18, 1946   DATE OF ADMISSION:  DATE OF DISCHARGE:                              HISTORY & PHYSICAL   CHIEF COMPLAINT:  Left lung mass.   HISTORY OF PRESENT ILLNESS:  This 65 year old African American female  quit smoking 6 months ago and was found to have a left perihilar opacity  on chest x-ray and a CT scan revealed a left upper lobe PET scan with  positives in this area.  Pulmonary function tests showed an FVC of 1.98  with an FEV of 1.32 indicative of mild obstructive pulmonary disease.  She has had no hemoptysis, fever, chills, excessive sputum.  She has  just mild shortness of breath in climbing 1 flight of stairs but can  make it up all the way.   PAST MEDICAL HISTORY:  Significant for:  1. Reflux.  2. Hypertension.  3. Dyslipidemia.  4. Diabetes, type 2.  5. Bronchitis.  6. Seborrheic keratosis.  7. Migraine headaches.  8. Allergic bronchitis.   SHE HAS NO ALLERGIES.   MEDICATIONS:  Include:  1. Lisinopril 20 mg a day.  2. Nexium 40 mg a day.  3. Hydrochlorothiazide 12.5 mg a day.  4. Simvastatin 40 mg a day.  5. Glucotrol 2.5 mg twice a day.  6. Zyrtec p.r.n.  7. Actos 30 mg daily.  8. Albuterol inhaler 2 puffs every 4 hours p.r.n.   FAMILY HISTORY:  Positive for lung cancer.  Her father died of lung  cancer in February 09, 2003.  It is also positive for kidney failure, diabetes,  hypothyroidism, cardiac disease.   SOCIAL HISTORY:  She works as a Sales promotion account executive for [employer].  She  is married and has 3 children.  She does not smoke, quit smoking in July  2008, does not drink alcohol on a regular basis.   REVIEW OF SYSTEMS:  Her weight is 173 pounds.  She is 5 feet 5.  CARDIAC:  No angina or atrial fibrillation.  PULMONARY:  See  history of  present illness.  GI:  Reflux.  GU:  No dysuria, kidney disease.  VASCULAR:  No claudication, DVT, TIAs.  NEUROLOGICAL:  Migraine  headaches.  No dizziness, blackouts, or seizures.  MUSCULOSKELETAL:  No  arthritis or joint pain.  PSYCHIATRIC:  She has been treated for  situational depression.  No nervousness.  EYE/ENT:  No change in her  eyesight or hearing.  HEMATOLOGICAL:  No problems with bleeding/clotting  disorders or anemia.   PHYSICAL EXAM:  She is a well-developed [place] American female in no  acute distress.  Her blood pressure is 148/84.  Pulse 100.  Respirations  18.  Sats were 93%.  HEAD:  Atraumatic.  EYES:  Pupils equal and reactive to light and accommodation.  Extraocular movements are normal.  EARS:  Tympanic membranes are intact.  NOSE:  There is no septal deviation.  THROAT:  Without lesions.  NECK:  Supple without thyromegaly.  There is no supraclavicular or  axillary adenopathy.  No carotid bruits.  CHEST:  Clear to auscultation and percussion.  HEART:  Regular sinus rhythm.  No murmurs.  ABDOMEN:  Soft.  There is no hepatosplenomegaly.  Bowel sounds are  normal.  EXTREMITIES:  Pulses are pulses 2+.  There is no clubbing or edema.  NEUROLOGICAL:  She is oriented x3.  Sensory and motor intact.  Cranial  nerves intact.  SKIN:  Without lesion.   IMPRESSION:  1. Left upper lobe mass, probable stage IB nonsmall cell lung cancer.  2. History of tobacco abuse.  3. Hypertension.  4. Dyslipidemia.  5. Diabetes, type 2.  6. Allergic bronchitis.  7. Migraine headaches.  8. Gastroesophageal reflux disease.   PLAN:  Left VATS left upper lobectomy.      Ines Bloomer, M.D.  Electronically Signed     DPB/MEDQ  D:  10/19/2007  T:  10/20/2007  Job:  [id number]   cc:   Georgann Housekeeper, MD

## 2011-01-14 NOTE — Assessment & Plan Note (Signed)
OFFICE VISIT   ZSOFIA, PROUT  DOB:  [birth year]                                        November 17, 2007  CHART #:  [id number]   HISTORY:  The patient came for followup today.  Her blood pressure was  135/79, pulse 100, respirations 18, saturation 95%.  Her incisions were  well-healed.  She was having some left shoulder pain.  Chest x-ray  showed further improvement.  We told her to stop using her oxygen at  night if she could and to only use it p.r.n. and we plan to pick it up  next time I see her.  We will give her a refill for Percocet as well as  for Celebrex 200 mg at night.  We plan to see her back again in 3 weeks  with a chest x-ray.   Ines Bloomer, M.D.  Electronically Signed   DPB/MEDQ  D:  11/17/2007  T:  11/17/2007  Job:  [id number]

## 2011-01-14 NOTE — Letter (Signed)
Jan 19, 2008   [name], MD  [phone]  [place], [place] [address]   Re:  [name], [name]              DOB:  [birth year]   Dear Arbutus Ped,   Kelly Herman came for followup today.  She has completed her chemotherapy  and will have a CT scan next week.   Her blood pressure is 112/68, pulse 100, respirations 18, sats were 95%.   Her chest x-ray today was stable.   I have given her a release to return to work.   I appreciate the opportunity of seeing [name].   Ines Bloomer, M.D.  Electronically Signed   DPB/MEDQ  D:  01/19/2008  T:  01/19/2008  Job:  [id number]

## 2011-01-14 NOTE — Letter (Signed)
October 14, 2007   Kelly Housekeeper, MD  [address]  [place], [place] [address]   Re:  Kelly Herman, Kelly Herman              DOB:  [birth year]   Dear Dr. Donette Larry:   I appreciate the opportunity of seeing Kelly Herman.  This 65 year old  African American female quit smoking about six months ago, was found to  have a left perihilar opacity on chest x-ray.  CT scan was done, which  revealed a left upper lobe mass.  A PET scan was done, which showed  increased uptake in the left upper lobe.  Her pulmonary function tests  done a couple of months ago showed an FVC of 1.98 with an FEV1 of 1.32,  indicative of moderate obstructive disease.  She gets shortness of  breath when climbing one stair, but can make it all the way to the top.  She has had no hemoptysis, fever, chills or excessive sputum.   PAST MEDICAL HISTORY:  She takes Nexium 40 a day for reflux, lisinopril  20 mg a day for hypertension, hydrochlorothiazide 1/2 tablet daily for  hypertension, Simvastatin 40 mg a day for dyslipidemia, Glucotrol 5 mg  1/2 tablet twice a day for diabetes type 2, Zyrtec p.r.n., Flomax  p.r.n., Actos 30 mg daily for diabetes, and Albuterol inhaler 2 puffs  every 4 hours.  She also has allergic rhinitis.  She had a history of  depression, history of bronchitis, history of seborrheic keratosis and  history of migraine headaches.   FAMILY HISTORY:  Positive for lung cancer in her father who died in Jan 26, 2003  of lung cancer and emphysema.  Her mother died of kidney failure.  Her  creatinine is 1.2.  There is also a positive family history of diabetes,  hypothyroidism, heart disease.   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  She is  married, has three children.  Does not smoke, quite smoking in July of  2008.  Does not drink alcohol on a regular basis.   REVIEW OF SYSTEMS:  She is 173 pounds.  She is 5 feet 5 inches.  CARDIAC:  No angina or atrial fibrillation.  PULMONARY:  See  history of present illness.  GI:  She has reflux.  GU:  No kidney disease, dysuria or frequent urination.  VASCULAR:  No claudication, DVT, TIAs.  NEUROLOGICAL:  She has migraine headaches.  No dizziness or blackouts.  MUSCULOSKELETAL:  No arthritis or joint pain.  PSYCHIATRIC:  Depression.  EYES/ENT:  No change in her eyesight or hearing.  HEMATOLOGICAL:  No problems with bleeding or clotting disorders.   PHYSICAL EXAMINATION:  She is a well developed, African American female  in no acute distress.  Her blood pressure is 148/84, pulse 100,  respirations 18.  Saturations are 93%.  Head, Eyes, Ears, Nose and Throat:  Unremarkable.  Neck:  Supple without  thyromegaly.  There is no supraclavicular axial adenopathy.  Chest:  Clear to auscultation and percussion.  Heart:  Regular sinus rhythm.  No  murmurs.  Abdomen:  Obese.  There is no hepatosplenomegaly.  Pulses are  2+.  There is no clubbing or edema.  Neurological:  She is oriented x3.  Sensory and motor intact.  Cranial nerves intact.   I feel that she is a good candidate for left upper lobectomy and we have  tentatively scheduled this for the 17th of  February at [facility].  I have explained the risks of the procedure including hemorrhage,  infection, pneumonia, pulmonary embolus, myocardial infarction, death.  She and her family understand the situation  and agree to the surgery.   I appreciate the opportunity of seeing Ms.  Herman.   Ines Bloomer, M.D.  Electronically Signed   DPB/MEDQ  D:  10/14/2007  T:  10/15/2007  Job:  [id number]

## 2011-01-14 NOTE — Letter (Signed)
November 03, 2007   Georgann Housekeeper, MD  [address]  [place], [place] [address]   Re:  Kelly Herman, Kelly Herman              DOB:  [birth year]   Dear Dr. Donette Larry:   I saw Ms. Schrupp back today.  Her blood pressure was 1114/69, pulse 100,  respirations 18.  Saturations were 92%, and she had been off oxygen for  an hour.  We had to send her home on her home oxygen, but she appears to  be improving and should be off oxygen completely within the next two to  three weeks.  She ended up having a left upper lobe with two satellite  nodules, which goes along with a stage IIIA cancer, and she will  probably require some postoperative chemotherapy.  She is seeing Dr.  Arbutus Ped for this.  Her incision is well healed and overall is doing well  from our standpoint.  I told her to gradually increase her activities.  We will see her back again in two weeks with a chest x-ray.  I gave her  a refill for Percocet.   Ines Bloomer, M.D.  Electronically Signed   DPB/MEDQ  D:  11/03/2007  T:  11/03/2007  Job:  [id number]   cc:   [name], MD

## 2011-01-14 NOTE — Consult Note (Signed)
Kelly Herman, EUTSLER NO.:  [id number]   MEDICAL RECORD NO.:  [id number]          PATIENT TYPE:  INP   LOCATION:  2022                         FACILITY:  [facility]   PHYSICIAN:  [name], MD  DATE OF BIRTH:  [birth year]   DATE OF CONSULTATION:  10/27/2007  DATE OF DISCHARGE:  10/28/2007                                 CONSULTATION   REASON FOR CONSULTATION:  Lung cancer.   REFERRING PHYSICIAN:  Ines Bloomer, M.D.   HISTORY OF PRESENT ILLNESS:  Kelly Herman is a very pleasant 65 year old  African American female, who was found on a routine annual chest x-ray  in January 2009 to have a left lung nodule.  This was followed by CT of  the chest on October 04, 2007, which showed a 2.7 x 1.8 x 3.8-cm soft  tissue mass in the left upper lobe.  A PET scan on October 12, 2007,  showed the left upper lobe lesion being moderately hypermetabolic, with  no mediastinal, hilar or distant metastatic disease.  A head CT on  October 18, 2007, was negative for metastasis.  On October 21, 2007,  the patient underwent a left upper lobectomy with node dissection under  the care of Dr. Edwyna Shell.  Pathology revealed a 2.8-cm well-differentiated  adenocarcinoma with additional satellite nodules with well-  differentiated adenocarcinoma, with no positive lymphadenopathy.  Dr.  Edwyna Shell asked Dr. Shirline Frees to evaluate the patient and discuss with her  adjuvant therapy options.  The patient is doing well today, recuperating  from surgery.   PAST MEDICAL HISTORY:  1. GERD.  2. Hypertension.  3. Dyslipidemia.  4. Diabetes mellitus type 2.  5. History of allergic bronchitis, advanced emphysema per chest x-ray.  6. History of seborrheic keratosis.  7. History of migraine headaches.  8. Former tobacco use.   SURGERIES:  Status post left VATS, left upper lobectomy with node  dissection, October 21, 2007, Dr. Edwyna Shell.   ALLERGIES:  No known drug allergies.   MEDICATIONS:  Dulcolax,  Prozac, Microzide, NovoLog, Prinivil, Lopressor,  Protonix, Actos, Zocor.  The following are p.r.n., including Zofran,  Percocet, Senokot and Ultram.   REVIEW OF SYSTEMS:  Remarkable for migraine headaches, GERD symptoms and  dyspnea on exertion.  No blood-tinged sputum.  No weight loss.  No bony  pain prior to surgery.  The rest of the review of systems is negative.   FAMILY HISTORY:  Mother alive and well.  Father died with lung cancer.   SOCIAL HISTORY:  The patient is married, she has three grown children.  No tobacco since 6 months ago.  Before that, the patient smoked half a  pack to one pack a day for 40-45 years.  No alcohol history.  The  patient lives in [place].  Works in the [employer].   PHYSICAL EXAM:  This is a well-developed, well-nourished 65 year old  African American female in no acute distress, alert and oriented x3.  Blood pressure 136/78, pulse 79, respirations 20, temperature 98, pulse  oximetry 94% in 1 L.  Weight 94.2 kg.  Height 5 feet 5 inches.  HEENT:  Normocephalic, atraumatic.  PERRLA.  Oral mucosa without thrush  or lesions.  NECK:  Supple.  No cervical or supraclavicular masses.  LUNGS:  With decreased breath sounds at the bases, somewhat atelectatic  sounds.  No rhonchi or rales.  No axillary masses.  The left incisional  scar is healing well, somewhat tender but erythema is seen.  CARDIOVASCULAR:  Regular rate and rhythm without murmurs, rubs or  gallops.  ABDOMEN:  Soft, nontender.  Bowel sounds x4.  No hepatosplenomegaly.  EXTREMITIES:  With no clubbing or cyanosis.  No edema.  No inguinal  masses.  BREASTS:  Not examined.  MUSCULOSKELETAL:  With no spinal tenderness.  NEUROLOGIC:  Nonfocal.   LABS:  Hemoglobin 9.7, hematocrit 29.1, white count 9.0, platelets 226,  MCV 86.5.  Sodium 137, potassium 3.7, BUN 11, creatinine 0.97, glucose  91, total bilirubin 0.6, alkaline phosphatase 94, AST 40, ALT 23, total  protein 6.0, albumin 2.8,  calcium 8.9.   ASSESSMENT/PLAN:  Dr. Arbutus Ped has seen and evaluated the patient and  reviewed the chart.  This is a pleasant lady with a known diagnosis of  stage IIIB (T4 N0 NX) non-small cell lung carcinoma, adenocarcinoma,  status post left upper lobectomy.  Dr. Arbutus Ped and had a long discussion  with the patient today about the disease, stage, prognosis and treatment  options.  It is recommended for her four cycles of adjuvant chemotherapy  with platinum-based regimen, to be given 4-6 weeks after her surgery.  We will schedule a follow-up appointment for her to be seen at the  [facility]ional cancer [facility] on November 10, 2007, at 9:30 a.m.   Thank you very much for allowing [place] to participate in the care of this  nice patient.      Marlowe Kays, P.A.      [name], MD  Electronically Signed    SW/MEDQ  D:  10/27/2007  T:  10/28/2007  Job:  [id number]   cc:   Georgann Housekeeper, MD

## 2011-01-14 NOTE — Op Note (Signed)
NAMEBERNEICE, ZETTLEMOYER NO.:  [id number]   MEDICAL RECORD NO.:  [id number]          PATIENT TYPE:  INP   LOCATION:  2301                         FACILITY:  [facility]   PHYSICIAN:  Ines Bloomer, M.D. DATE OF BIRTH:  02/22/1946   DATE OF PROCEDURE:  10/21/2007  DATE OF DISCHARGE:                               OPERATIVE REPORT   PREOPERATIVE DIAGNOSIS:  Left upper lobe mass.   POSTOPERATIVE DIAGNOSIS:  Adenocarcinoma of left upper lobe.   OPERATION PERFORMED:  1. Left video-assisted thoracic surgery.  2. Mini-thoracotomy.  3. Left upper lobectomy with node dissection.   SURGEON:  D.P. Edwyna Shell, MD   FIRST ASSISTANT:  Gershon Crane, PA-C   ANESTHESIA:  General anesthesia.   DESCRIPTION OF PROCEDURE:  After percutaneous insertion of all  monitoring lines, the patient underwent general anesthesia.  She was  turned to the left lateral thoracotomy position.  A dual-lumen tube had  been inserted.  The left lung was deflated.  The patient was prepped and  draped in the usual sterile manner.  Two trocar sites were made in the  anterior and posterior axillary lines at the 7th intercostal space.  Two  trocars were inserted.  A 0-degree scope was inserted.  A lesion was  seen in the anterior segments of the left upper lobe.  The fissure was  complete.  A small incision was made over the triangle of auscultation,  partially dividing the latissimus, reflecting the serratus anteriorly,  entering the 5th intercostal space, putting a small Tuffier in the  intercostal space.  Dissection was started anteriorly, dissecting out  the superior pulmonary vein and several of 10 L nodes around the vein.  We then stapled and divided that with the Autosuture 30 gray  Roticulator.  The pulmonary artery was dissected out, dissecting out a 5  and several 10 L nodes, dissecting out the apical posterior branch and  the anterior branch and then the fissure was divided with  electrocautery, since  it was complete.  In the inferior portion of the  fissure, there were several 11 L nodes that were dissected free from  around the lingula.  The lingula was stapled and divided with the  Autosuture 30 gray Roticulator and several more 10 L nodes were  dissected around the anterior and apical posterior branch.  The apical  posterior branch was divided with the Autosuture 30 gray Roticulator,  then the bronchus were stapled with a TA-30 and divided distally.  The  left upper lobe was removed; it was adenocarcinoma with negative margins  on frozen section.  Two chest tubes were placed through the trocar sites  and tied in place with 3-0 silk.  A single On-Q  was inserted in the usual fashion.  CoSeal was applied to the staple  line.  The chest was closed with 3 pericostal, drilling through the 6th  rib and passing around the 5th rib, #1 Vicryl in the muscle layer, 2-0  Vicryl in the subcutaneous tissue and Dermabond for the skin.  The  patient returned to the recovery room in stable condition.  Ines Bloomer, M.D.  Electronically Signed     DPB/MEDQ  D:  10/21/2007  T:  10/22/2007  Job:  [id number]

## 2011-01-14 NOTE — Assessment & Plan Note (Signed)
OFFICE VISIT   Kelly, Herman  DOB:  1946-03-15                                        December 08, 2007  CHART #:  [id number]   [name] comes in today for followup.  She is status post a left upper  lobectomy for adenocarcinoma on October 21, 2007.  She has done well  since her last visit.  She has seen Dr. Arbutus Ped and has completed 1 of 4  chemotherapy treatments.  She has had some nausea secondary to her chemo  but otherwise is doing well.  The pain about which she was complaining  on her last office visit has completely resolved, and she is not using  pain medication at all at this point.  She has also discontinued her  oxygen at night and is not using it at all now.   PHYSICAL EXAMINATION:  VITAL SIGNS:  Blood pressure 135/78, pulse 102,  respirations 18, O2 sat 94% on room air.  GENERAL:  Her incisions are healing well and 2 chest tube sutures were  removed today.  LUNGS:  Clear.  HEART:  Regular rate and rhythm.  Slightly tachycardiac but no murmurs,  rubs, or gallops.   Chest x-ray shows continued improvement.   ASSESSMENT/PLAN:  Kelly Herman is status post left upper lobectomy.  She  will continue her chemotherapy and followup with Dr. Arbutus Ped as  directed.  Dr. Edwyna Shell has also seen the patient today and will plan on  seeing her back in the office for followup in 2 more months with a chest  x-ray.  She will call in the interim if she experiences problems or has  questions.   Ines Bloomer, M.D.  Electronically Signed   GC/MEDQ  D:  12/08/2007  T:  12/08/2007  Job:  [id number]   cc:   [name], MD

## 2011-01-14 NOTE — Discharge Summary (Signed)
NAMEUCHECHI, DENISON NO.:  [id number]   MEDICAL RECORD NO.:  [id number]          PATIENT TYPE:  INP   LOCATION:  2022                         FACILITY:  [facility]   PHYSICIAN:  Ines Bloomer, M.D. DATE OF BIRTH:  1945-09-27   DATE OF ADMISSION:  10/21/2007  DATE OF DISCHARGE:  10/27/2007                               DISCHARGE SUMMARY   DISCHARGE DIAGNOSES:  1. Light left upper lobe mass, adenocarcinoma T3 N0 M0.  2. Acute blood loss anemia postoperatively.  3. Gastroesophageal reflux disease.  4. Hypertension.  5. Dyslipidemia.  6. Type 2 diabetes mellitus.  7. Bronchitis.  8. Seborrheic keratosis.  9. Migraine headaches.  10.Allergic bronchitis.   PROCEDURES:  1. Left video-assisted thoracoscopic surgery with mini-thoracotomy.  2. Left upper lobectomy with lymph node dissection.   HISTORY OF PRESENT ILLNESS:  The patient is a 65 year old, African  American female who quit smoking 6 months ago and was found to have a  left perihilar opacity on chest x-ray and a CT scan revealed a left  upper lobe mass.  Pet scan done confirms to be positive in this area.  PFTs done showed FVC of 1.98, FEV of 1.32 which was indicative of a mild  obstructive pulmonary disease.  The patient denies hemoptysis, fever,  chills, excessive sputum.  She has complained of mild shortness of  breath in climbing 1 flight of stairs, but can make it all the way up.  The patient was seen and evaluated by Dr. Edwyna Shell.  Dr. Edwyna Shell discussed  with the patient undergoing left upper lobectomy.  He discussed risks  and benefits with the patient.  The patient acknowledged understanding  and agreed to proceed.  Surgery was scheduled for October 21, 2007.  For details of the patient's past medical history and physical exam,  please see dictated H&P.   HOSPITAL COURSE:  The patient was taken to the operating room on  October 21, 2007, where she underwent left video-assisted thoracoscopic  surgery with mini-thoracotomy, left upper lobectomy with known  dissection.  The patient tolerated this procedure and was transferred to  the intensive care unit in stable condition.  Postoperatively, the  patient was able to be extubated.  She was placed on nasal cannula.  Post extubation, she was alert and oriented x4 and neurologically  intact.  The patient was noted to be hemodynamically stable  postoperatively.  Daily chest x-rays were obtained.  Chest x-ray was  stable postop day #1.  Minimum drainage from chest tubes and no air leak  noted.  Suction was decreased at this time.  Followup chest x-ray postop  day #2, remained stable with small air leak.  Posterior chest tube  discontinued.  The patient continued to use her incentive spirometer.  By postop day #4, a chest x-ray remained stable with no pneumothorax.  There is no air leak noted with minimum drainage from chest tubes.  Remaining chest tube was discontinued in normal fashion.  Followup chest  x-ray remained stable with no pneumothorax.  Unfortunately, the patient  remained on nasal cannula at 2 L during this time.  She was attempted to  be weaned off oxygen.  Unfortunately, with ambulation, the patient would  desaturate into the high 70s, low 80s.  She was continued to be  attempted to be weaned and currently unable to.  Plan to arrange for  home O2 at time of discharge.  Postoperatively, the patient was noted to  be hemodynamically stable.  Hemoglobin/hematocrit were followed.  She  did not require any transfusions.  She was asymptomatic from the anemia.  H&H remained stable.  Postoperatively, vital signs were followed  closely.  She remained afebrile.  The patient remained in normal sinus  rhythm.  She was noted to be hypertensive which she does have a history  of and restarted on her lisinopril.  The patient continued to remain  hypertensive and she was started on low-dose beta blocker.  Blood  pressure continued to be  followed and did improve.  The patient's blood  sugars were followed postoperatively for diabetes.  She was restarted on  her glipizide.  CBGs were stable prior to discharge.  Postoperatively,  the patient was ambulating with assistance.  She was progressing well.  She was tolerating diet well with no nausea or vomiting noted.  Incisions were continued to be monitored, remained clean, dry and intact  and healing well.  The patient is tentatively ready for discharge home  in the a.m., postop day #7, on October 28, 2007.   FOLLOWUP:  Followup appointment has been arranged with Dr. Edwyna Shell for  November 03, 2007, at 2:15 p.m.  The patient will need to obtain PA and  lateral chest x-ray 30 minutes prior to this appointment.   ACTIVITY:  The patient was instructed in no driving until released to do  so, no lifting over 10 pounds.  She is told to ambulate 3-4 times per  day and progress as tolerated and to continue her breathing exercises.   SPECIAL INSTRUCTIONS:  Home health nurse has been arranged to continue  to monitor the patient's O2 levels.  Home O2 has been arranged.   WOUND CARE:  The patient is told to shower, washing her incisions using  soap and water.  She is to contact the office if she develops any  drainage or openings from any of her incision sites.   DIET:  The patient was educated on diet to be low-fat, low-salt as well  as diabetic diet.   DISCHARGE MEDICATIONS:  1. Fluoxetine 20 mg daily.  2. Simvastatin 40 mg daily.  3. Lisinopril 20 mg daily.  4. Nexium 40 mg daily.  5. Actos 30 mg daily.  6. Glipizide 2.5 mg daily.  7. Hydrochlorothiazide 12.5 mg daily.  8. Albuterol inhaler as used at home.  9. Oxycodone 5 mg 1-2 tablets q.4-6 hours p.r.n.  10.Lopressor 12.5 mg two times per day.      Theda Belfast, [place]      Ines Bloomer, M.D.  Electronically Signed   KMD/MEDQ  D:  10/27/2007  T:  10/28/2007  Job:  [id number]   cc:   [name], MD

## 2011-05-19 ENCOUNTER — Other Ambulatory Visit (HOSPITAL_COMMUNITY): Payer: 59

## 2011-05-20 ENCOUNTER — Other Ambulatory Visit: Payer: Self-pay | Admitting: Internal Medicine

## 2011-05-20 ENCOUNTER — Encounter (HOSPITAL_BASED_OUTPATIENT_CLINIC_OR_DEPARTMENT_OTHER): Payer: Medicare Other | Admitting: Internal Medicine

## 2011-05-20 ENCOUNTER — Ambulatory Visit (HOSPITAL_COMMUNITY)
Admission: RE | Admit: 2011-05-20 | Discharge: 2011-05-20 | Disposition: A | Discharge status: Home / Self Care | Payer: Medicare Other | Source: Ambulatory Visit | Attending: Internal Medicine | Admitting: Internal Medicine

## 2011-05-20 DIAGNOSIS — E278 Other specified disorders of adrenal gland: Secondary | ICD-10-CM | POA: Insufficient documentation

## 2011-05-20 DIAGNOSIS — C3490 Malignant neoplasm of unspecified part of unspecified bronchus or lung: Secondary | ICD-10-CM | POA: Insufficient documentation

## 2011-05-20 DIAGNOSIS — Z902 Acquired absence of lung [part of]: Secondary | ICD-10-CM | POA: Insufficient documentation

## 2011-05-20 LAB — CBC WITH DIFFERENTIAL/PLATELET
BASO%: 0.2 % (ref 0.0–2.0)
HCT: 38.9 % (ref 34.8–46.6)
HGB: 12.6 g/dL (ref 11.6–15.9)
MCHC: 32.4 g/dL (ref 31.5–36.0)
MONO#: 0.4 10*3/uL (ref 0.1–0.9)
NEUT#: 4.6 10*3/uL (ref 1.5–6.5)
NEUT%: 54.6 % (ref 38.4–76.8)
WBC: 8.4 10*3/uL (ref 3.9–10.3)
lymph#: 3.1 10*3/uL (ref 0.9–3.3)

## 2011-05-20 LAB — CMP (CANCER CENTER ONLY)
ALT(SGPT): 28 U/L (ref 10–47)
AST: 28 U/L (ref 11–38)
Albumin: 3.7 g/dL (ref 3.3–5.5)
BUN, Bld: 22 mg/dL (ref 7–22)
Calcium: 9.4 mg/dL (ref 8.0–10.3)
Chloride: 103 mEq/L (ref 98–108)
Potassium: 4.3 mEq/L (ref 3.3–4.7)

## 2011-05-20 MED ORDER — IOHEXOL 300 MG/ML  SOLN
80.0000 mL | Freq: Once | INTRAMUSCULAR | Status: AC | PRN
  Administered 2011-05-20: 80 mL via INTRAVENOUS

## 2011-05-22 ENCOUNTER — Encounter (HOSPITAL_BASED_OUTPATIENT_CLINIC_OR_DEPARTMENT_OTHER): Payer: Medicare Other | Admitting: Internal Medicine

## 2011-05-22 DIAGNOSIS — C3490 Malignant neoplasm of unspecified part of unspecified bronchus or lung: Secondary | ICD-10-CM

## 2011-05-26 LAB — CBC
HCT: 29.1 — ABNORMAL LOW
HCT: 31 — ABNORMAL LOW
HCT: 32.6 — ABNORMAL LOW
Hemoglobin: 10.3 — ABNORMAL LOW
Hemoglobin: 9.7 — ABNORMAL LOW
MCHC: 33.3
MCHC: 33.5
MCV: 86.3
MCV: 86.5
MCV: 86.6
Platelets: 242
RBC: 3.36 — ABNORMAL LOW
RBC: 3.54 — ABNORMAL LOW
RBC: 3.77 — ABNORMAL LOW
RBC: 4.31
RDW: 14.5
WBC: 11.8 — ABNORMAL HIGH
WBC: 12.9 — ABNORMAL HIGH
WBC: 7.7

## 2011-05-26 LAB — BLOOD GAS, ARTERIAL
Drawn by: 206361
pCO2 arterial: 43.5
pH, Arterial: 7.411 — ABNORMAL HIGH
pO2, Arterial: 85.6

## 2011-05-26 LAB — BASIC METABOLIC PANEL
BUN: 12
CO2: 34 — ABNORMAL HIGH
Chloride: 104
Chloride: 99
Creatinine, Ser: 0.97
GFR calc Af Amer: >60
GFR calc Af Amer: >60
Potassium: 3.7
Potassium: 4.1

## 2011-05-26 LAB — COMPREHENSIVE METABOLIC PANEL
ALT: 27
AST: 28
Albumin: 3.7
Alkaline Phosphatase: 135 — ABNORMAL HIGH
Alkaline Phosphatase: 94
BUN: 7
Calcium: 8.8
Chloride: 102
GFR calc Af Amer: >60
GFR calc non Af Amer: 57 — ABNORMAL LOW
Glucose, Bld: 123 — ABNORMAL HIGH
Potassium: 4.1
Potassium: 4.2
Sodium: 137
Total Bilirubin: 0.4
Total Protein: 6

## 2011-05-26 LAB — URINE MICROSCOPIC-ADD ON

## 2011-05-26 LAB — TYPE AND SCREEN
ABO/RH(D): AB NEG
PT AG Type: NEGATIVE

## 2011-05-26 LAB — URINALYSIS, ROUTINE W REFLEX MICROSCOPIC
Bilirubin Urine: NEGATIVE
Glucose, UA: NEGATIVE
Hgb urine dipstick: NEGATIVE
Nitrite: NEGATIVE
Specific Gravity, Urine: 1.026
pH: 6

## 2011-05-26 LAB — POCT I-STAT 3, ART BLOOD GAS (G3+)
Acid-base deficit: 2
Bicarbonate: 24.9 — ABNORMAL HIGH
O2 Saturation: 90
pO2, Arterial: 65 — ABNORMAL LOW

## 2011-05-26 LAB — APTT: aPTT: 33

## 2011-05-28 LAB — CROSSMATCH: ABO/RH(D): AB NEG

## 2011-05-28 LAB — POCT I-STAT, CHEM 8
Calcium, Ion: 1.13
Chloride: 103
Creatinine, Ser: 1.6 — ABNORMAL HIGH
Glucose, Bld: 142 — ABNORMAL HIGH
HCT: 43
Hemoglobin: 14.6
Potassium: 3.9

## 2011-05-28 LAB — CBC
HCT: 40.3
Hemoglobin: 13.6
MCHC: 33.8
MCV: 86.7
RBC: 4.65
WBC: 7.4

## 2011-05-28 LAB — DIFFERENTIAL
Basophils Relative: 0
Eosinophils Absolute: 0
Lymphs Abs: 2
Monocytes Absolute: 0.1
Monocytes Relative: 1 — ABNORMAL LOW

## 2011-05-28 LAB — POCT CARDIAC MARKERS
CKMB, poc: <1 — ABNORMAL LOW
Troponin i, poc: <0.05

## 2011-09-30 ENCOUNTER — Telehealth: Payer: Self-pay | Admitting: Internal Medicine

## 2011-09-30 ENCOUNTER — Other Ambulatory Visit: Payer: Self-pay | Admitting: Internal Medicine

## 2011-09-30 DIAGNOSIS — C3490 Malignant neoplasm of unspecified part of unspecified bronchus or lung: Secondary | ICD-10-CM

## 2011-09-30 NOTE — Telephone Encounter (Signed)
pt called for 10/2011 appts,appts looked up in mosaiq and made for 3/14 and 3/19  aom

## 2011-11-13 ENCOUNTER — Other Ambulatory Visit (HOSPITAL_BASED_OUTPATIENT_CLINIC_OR_DEPARTMENT_OTHER): Payer: Medicare Other | Admitting: Lab

## 2011-11-13 ENCOUNTER — Ambulatory Visit (HOSPITAL_COMMUNITY)
Admission: RE | Admit: 2011-11-13 | Discharge: 2011-11-13 | Disposition: A | Discharge status: Home / Self Care | Payer: Medicare Other | Source: Ambulatory Visit | Attending: Internal Medicine | Admitting: Internal Medicine

## 2011-11-13 DIAGNOSIS — J438 Other emphysema: Secondary | ICD-10-CM | POA: Insufficient documentation

## 2011-11-13 DIAGNOSIS — C3490 Malignant neoplasm of unspecified part of unspecified bronchus or lung: Secondary | ICD-10-CM

## 2011-11-13 DIAGNOSIS — E279 Disorder of adrenal gland, unspecified: Secondary | ICD-10-CM

## 2011-11-13 DIAGNOSIS — K449 Diaphragmatic hernia without obstruction or gangrene: Secondary | ICD-10-CM | POA: Insufficient documentation

## 2011-11-13 DIAGNOSIS — E278 Other specified disorders of adrenal gland: Secondary | ICD-10-CM | POA: Insufficient documentation

## 2011-11-13 DIAGNOSIS — N289 Disorder of kidney and ureter, unspecified: Secondary | ICD-10-CM

## 2011-11-13 LAB — CMP (CANCER CENTER ONLY)
Albumin: 3.7 g/dL (ref 3.3–5.5)
BUN, Bld: 21 mg/dL (ref 7–22)
CO2: 29 mEq/L (ref 18–33)
Calcium: 9.1 mg/dL (ref 8.0–10.3)
Chloride: 96 mEq/L — ABNORMAL LOW (ref 98–108)
Glucose, Bld: 91 mg/dL (ref 73–118)
Potassium: 4.4 mEq/L (ref 3.3–4.7)
Sodium: 141 mEq/L (ref 128–145)
Total Protein: 7 g/dL (ref 6.4–8.1)

## 2011-11-13 LAB — CBC WITH DIFFERENTIAL/PLATELET
Basophils Absolute: 0 10*3/uL (ref 0.0–0.1)
Eosinophils Absolute: 0.3 10*3/uL (ref 0.0–0.5)
HCT: 38.1 % (ref 34.8–46.6)
HGB: 12.2 g/dL (ref 11.6–15.9)
MCH: 28.2 pg (ref 25.1–34.0)
NEUT#: 3.6 10*3/uL (ref 1.5–6.5)
NEUT%: 49.5 % (ref 38.4–76.8)
RDW: 14.6 % — ABNORMAL HIGH (ref 11.2–14.5)
lymph#: 2.9 10*3/uL (ref 0.9–3.3)

## 2011-11-13 MED ORDER — IOHEXOL 300 MG/ML  SOLN
80.0000 mL | Freq: Once | INTRAMUSCULAR | Status: AC | PRN
  Administered 2011-11-13: 80 mL via INTRAVENOUS

## 2011-11-18 ENCOUNTER — Ambulatory Visit (HOSPITAL_BASED_OUTPATIENT_CLINIC_OR_DEPARTMENT_OTHER): Payer: Medicare Other | Admitting: Internal Medicine

## 2011-11-18 ENCOUNTER — Telehealth: Payer: Self-pay | Admitting: Internal Medicine

## 2011-11-18 VITALS — BP 157/77 | HR 94 | Temp 97.6°F | Wt 189.1 lb

## 2011-11-18 DIAGNOSIS — C3410 Malignant neoplasm of upper lobe, unspecified bronchus or lung: Secondary | ICD-10-CM | POA: Insufficient documentation

## 2011-11-18 DIAGNOSIS — C3490 Malignant neoplasm of unspecified part of unspecified bronchus or lung: Secondary | ICD-10-CM

## 2011-11-18 DIAGNOSIS — Z85118 Personal history of other malignant neoplasm of bronchus and lung: Secondary | ICD-10-CM

## 2011-11-18 DIAGNOSIS — Z9221 Personal history of antineoplastic chemotherapy: Secondary | ICD-10-CM

## 2011-11-18 NOTE — Telephone Encounter (Signed)
Gv pt appt for sept2013.  scheduled pt for ct scan on 09/16 @ [facility]

## 2011-11-18 NOTE — Progress Notes (Signed)
[facility] [phone]   [phone]  OFFICE PROGRESS NOTE  PRINCIPAL DIAGNOSIS:  Stage IIIB (T4, N0, M0) non-small cell lung cancer, adenocarcinoma diagnosed in February 2009.  PRIOR THERAPY:   1. Status post left upper lobectomy with lymph node dissection under the care of Dr. Edwyna Shell on October 21, 2007. 2. Status post 4 cycles of adjuvant chemotherapy with cisplatin and Taxotere.  Last dose was given Jan 20, 2008.  CURRENT THERAPY:  Observation.  INTERVAL HISTORY: Kelly Herman 66 y.o. female returns to the clinic today for six-month followup visit. The patient is doing fine today with no specific complaints. She denied having any significant chest pain or shortness breath, no cough or hemoptysis. She has no weight loss or night sweats. She has repeat CT scan of the chest performed recently and she is here today for evaluation and discussion of her scan results.  ALLERGIES:   has no known allergies.  MEDICATIONS:  Current Outpatient Prescriptions  Medication Sig Dispense Refill  . aspirin 81 MG tablet Take 81 mg by mouth daily.      Marland Kitchen zolpidem (AMBIEN) 10 MG tablet Take 10 mg by mouth at bedtime as needed.        REVIEW OF SYSTEMS:  A comprehensive review of systems was negative.   PHYSICAL EXAMINATION: General appearance: alert, cooperative and no distress Neck: no adenopathy Lymph nodes: Cervical, supraclavicular, and axillary nodes normal. Resp: clear to auscultation bilaterally Cardio: regular rate and rhythm, S1, S2 normal, no murmur, click, rub or gallop GI: soft, non-tender; bowel sounds normal; no masses,  no organomegaly Extremities: extremities normal, atraumatic, no cyanosis or edema Neurologic: Alert and oriented X 3, normal strength and tone. Normal symmetric reflexes. Normal coordination and gait  ECOG PERFORMANCE STATUS: 0 - Asymptomatic  Blood pressure 157/77, pulse 94, temperature 97.6 F (36.4 C), temperature  source Oral, weight 189 lb 1.6 oz (85.775 kg).  LABORATORY DATA: Lab Results  Component Value Date   WBC 7.2 11/13/2011   HGB 12.2 11/13/2011   HCT 38.1 11/13/2011   MCV 88.0 11/13/2011   PLT 191 11/13/2011      Chemistry      Component Value Date/Time   NA 141 11/13/2011 1529   NA 140 11/13/2009 1609   K 4.4 11/13/2011 1529   K 4.2 11/13/2009 1609   CL 96* 11/13/2011 1529   CL 103 11/13/2009 1609   CO2 29 11/13/2011 1529   CO2 29 11/13/2009 1609   BUN 21 11/13/2011 1529   BUN 23 11/13/2009 1609   CREATININE 1.5* 11/13/2011 1529   CREATININE 1.54* 11/13/2009 1609      Component Value Date/Time   CALCIUM 9.1 11/13/2011 1529   CALCIUM 9.5 11/13/2009 1609   ALKPHOS 118* 11/13/2011 1529   ALKPHOS 143* 11/13/2009 1609   AST 28 11/13/2011 1529   AST 26 11/13/2009 1609   ALT 18 11/13/2009 1609   BILITOT 0.50 11/13/2011 1529   BILITOT 0.5 11/13/2009 1609       RADIOGRAPHIC STUDIES: Ct Chest W Contrast  11/13/2011  *RADIOLOGY REPORT*  Clinical Data: History of lung cancer diagnosed in 2009. Chemotherapy completed.  No current complaints.  CT CHEST WITH CONTRAST  Technique:  Multidetector CT imaging of the chest was performed following the standard protocol during bolus administration of intravenous contrast.  Contrast: 80mL OMNIPAQUE IOHEXOL 300 MG/ML IJ SOLN  Comparison: CT of the thorax 05/20/2011.  Findings:  Mediastinum: Heart size is normal. There  is no significant pericardial fluid, thickening or pericardial calcification. No pathologically enlarged mediastinal or hilar lymph nodes. Surgical clips and left hilar region related to prior left upper lobectomy. There is a small hiatal hernia.  Lungs/Pleura: Status post left upper lobectomy.  Background of moderate centrilobular emphysema with mild diffuse bronchial wall thickening.  No definite suspicious appearing pulmonary nodules or masses are identified.  No consolidative airspace disease.  No pleural effusions.  A small area of scarring in the  periphery of the left lower lobe near the base is unchanged.  Upper Abdomen: 1.9 x 1.3 cm nodule in the lateral limb of the left adrenal gland is unchanged compared to the prior examinations.  The remaining visualized portions of the upper abdomen are otherwise unremarkable.  Musculoskeletal: There are no aggressive appearing lytic or blastic lesions noted in the visualized portions of the skeleton.  IMPRESSION: 1.  Status post left upper lobectomy with no evidence to suggest local recurrence or new metastatic disease within the thorax. 2.  Unchanged 1.9 x 1.3 cm left adrenal nodule.  Although not technically characterize on today's examination, the stability in size and appearance of this nodule compared to a remote prior studies dating back to at least October 04, 2007 would suggest a benign lesion such as an adenoma. 3.  Background of moderate centrilobular emphysema and mild diffuse bronchial wall thickening indicative of underlying COPD.  Original Report Authenticated By: Florencia Reasons, M.D.    ASSESSMENT: This is a very pleasant 66 years old American female with history of stage IIIB non-small cell lung cancer status post left upper lobectomy followed by 4 cycles of adjuvant chemotherapy. The patient has been observation since May of 2009. She has no evidence for disease recurrence on his recent scan.  PLAN: I discussed the scan results with the patient and recommended for her continuous observation for now with repeat CT scan of the chest in 6 months. She will come back for followup visit at that time. The patient was advised to call me immediately if she has any concerning symptoms in the interval.   All questions were answered. The patient knows to call the clinic with any problems, questions or concerns. We can certainly see the patient much sooner if necessary.

## 2012-05-17 ENCOUNTER — Ambulatory Visit (HOSPITAL_COMMUNITY)
Admission: RE | Admit: 2012-05-17 | Discharge: 2012-05-17 | Disposition: A | Discharge status: Home / Self Care | Payer: Medicare Other | Source: Ambulatory Visit | Attending: Internal Medicine | Admitting: Internal Medicine

## 2012-05-17 ENCOUNTER — Other Ambulatory Visit (HOSPITAL_BASED_OUTPATIENT_CLINIC_OR_DEPARTMENT_OTHER): Payer: Medicare Other

## 2012-05-17 DIAGNOSIS — E279 Disorder of adrenal gland, unspecified: Secondary | ICD-10-CM | POA: Insufficient documentation

## 2012-05-17 DIAGNOSIS — C3490 Malignant neoplasm of unspecified part of unspecified bronchus or lung: Secondary | ICD-10-CM

## 2012-05-17 DIAGNOSIS — Z85118 Personal history of other malignant neoplasm of bronchus and lung: Secondary | ICD-10-CM | POA: Insufficient documentation

## 2012-05-17 DIAGNOSIS — Z902 Acquired absence of lung [part of]: Secondary | ICD-10-CM | POA: Insufficient documentation

## 2012-05-17 DIAGNOSIS — K449 Diaphragmatic hernia without obstruction or gangrene: Secondary | ICD-10-CM | POA: Insufficient documentation

## 2012-05-17 DIAGNOSIS — J438 Other emphysema: Secondary | ICD-10-CM | POA: Insufficient documentation

## 2012-05-17 DIAGNOSIS — J984 Other disorders of lung: Secondary | ICD-10-CM | POA: Insufficient documentation

## 2012-05-17 DIAGNOSIS — C3410 Malignant neoplasm of upper lobe, unspecified bronchus or lung: Secondary | ICD-10-CM

## 2012-05-17 LAB — COMPREHENSIVE METABOLIC PANEL (CC13)
ALT: 13 U/L (ref 0–55)
AST: 20 U/L (ref 5–34)
Albumin: 3.6 g/dL (ref 3.5–5.0)
BUN: 21 mg/dL (ref 7.0–26.0)
CO2: 26 mEq/L (ref 22–29)
Calcium: 9.6 mg/dL (ref 8.4–10.4)
Chloride: 104 mEq/L (ref 98–107)
Potassium: 3.8 mEq/L (ref 3.5–5.1)

## 2012-05-17 LAB — CBC WITH DIFFERENTIAL/PLATELET
BASO%: 0.4 % (ref 0.0–2.0)
EOS%: 4.3 % (ref 0.0–7.0)
HGB: 11.1 g/dL — ABNORMAL LOW (ref 11.6–15.9)
MCH: 28.8 pg (ref 25.1–34.0)
MCHC: 32.4 g/dL (ref 31.5–36.0)
RDW: 14.8 % — ABNORMAL HIGH (ref 11.2–14.5)
WBC: 7.4 10*3/uL (ref 3.9–10.3)
lymph#: 2.6 10*3/uL (ref 0.9–3.3)

## 2012-05-17 MED ORDER — IOHEXOL 300 MG/ML  SOLN
80.0000 mL | Freq: Once | INTRAMUSCULAR | Status: AC | PRN
  Administered 2012-05-17: 80 mL via INTRAVENOUS

## 2012-05-19 ENCOUNTER — Telehealth: Payer: Self-pay | Admitting: Internal Medicine

## 2012-05-19 ENCOUNTER — Ambulatory Visit (HOSPITAL_BASED_OUTPATIENT_CLINIC_OR_DEPARTMENT_OTHER): Payer: Medicare Other | Admitting: Internal Medicine

## 2012-05-19 VITALS — BP 163/86 | HR 88 | Temp 97.3°F | Resp 20 | Ht 65.5 in | Wt 187.9 lb

## 2012-05-19 DIAGNOSIS — C3490 Malignant neoplasm of unspecified part of unspecified bronchus or lung: Secondary | ICD-10-CM

## 2012-05-19 DIAGNOSIS — C3410 Malignant neoplasm of upper lobe, unspecified bronchus or lung: Secondary | ICD-10-CM

## 2012-05-19 DIAGNOSIS — N289 Disorder of kidney and ureter, unspecified: Secondary | ICD-10-CM

## 2012-05-19 NOTE — Progress Notes (Signed)
[facility] [phone]   [phone]  OFFICE PROGRESS NOTE  PRINCIPAL DIAGNOSIS: Stage IIIB (T4, N0, M0) non-small cell lung cancer, adenocarcinoma diagnosed in February 2009.   PRIOR THERAPY:  1. Status post left upper lobectomy with lymph node dissection under the care of Dr. Edwyna Shell on October 21, 2007. 2. Status post 4 cycles of adjuvant chemotherapy with cisplatin and Taxotere. Last dose was given Jan 20, 2008.  CURRENT THERAPY: Observation.  INTERVAL HISTORY: Kelly Herman 66 y.o. female returns to the clinic today for routine six-month followup visit. The patient is feeling fine today with no specific complaints. She denied having any significant weight loss or night sweats. She has no chest pain, shortness breath, cough or hemoptysis. The patient has repeat CT scan of the chest performed recently and she is here for evaluation and discussion of her scan results.  ALLERGIES:   has no known allergies.  MEDICATIONS:  Current Outpatient Prescriptions  Medication Sig Dispense Refill  . esomeprazole (NEXIUM) 40 MG capsule Take 40 mg by mouth daily before breakfast.      . FLUoxetine HCl (PROZAC PO) Take by mouth. Pt unsure of dose      . glipiZIDE (GLUCOTROL) 5 MG tablet Take 5 mg by mouth daily.      Marland Kitchen losartan (COZAAR) 25 MG tablet Take 25 mg by mouth Daily.      . pioglitazone (ACTOS) 30 MG tablet Take 30 mg by mouth daily.      . simvastatin (ZOCOR) 40 MG tablet Take 40 mg by mouth every evening.      Marland Kitchen aspirin 81 MG tablet Take 81 mg by mouth daily.      Marland Kitchen LORazepam (ATIVAN) 1 MG tablet Take 1 mg by mouth every 6 (six) hours as needed.        REVIEW OF SYSTEMS:  A comprehensive review of systems was negative.   PHYSICAL EXAMINATION: General appearance: alert, cooperative and no distress Head: Normocephalic, without obvious abnormality, atraumatic Lymph nodes: Cervical, supraclavicular, and axillary nodes normal. Resp: clear to  auscultation bilaterally Cardio: regular rate and rhythm, S1, S2 normal, no murmur, click, rub or gallop GI: soft, non-tender; bowel sounds normal; no masses,  no organomegaly Extremities: extremities normal, atraumatic, no cyanosis or edema Neurologic: Alert and oriented X 3, normal strength and tone. Normal symmetric reflexes. Normal coordination and gait  ECOG PERFORMANCE STATUS: 1 - Symptomatic but completely ambulatory  Blood pressure 163/86, pulse 88, temperature 97.3 F (36.3 C), resp. rate 20, height 5' 5.5" (1.664 m), weight 187 lb 14.4 oz (85.231 kg).  LABORATORY DATA: Lab Results  Component Value Date   WBC 7.4 05/17/2012   HGB 11.1* 05/17/2012   HCT 34.1* 05/17/2012   MCV 89.1 05/17/2012   PLT 177 05/17/2012      Chemistry      Component Value Date/Time   NA 139 05/17/2012 1532   NA 141 11/13/2011 1529   NA 140 11/13/2009 1609   K 3.8 05/17/2012 1532   K 4.4 11/13/2011 1529   K 4.2 11/13/2009 1609   CL 104 05/17/2012 1532   CL 96* 11/13/2011 1529   CL 103 11/13/2009 1609   CO2 26 05/17/2012 1532   CO2 29 11/13/2011 1529   CO2 29 11/13/2009 1609   BUN 21.0 05/17/2012 1532   BUN 21 11/13/2011 1529   BUN 23 11/13/2009 1609   CREATININE 1.6* 05/17/2012 1532   CREATININE 1.5* 11/13/2011 1529  CREATININE 1.54* 11/13/2009 1609      Component Value Date/Time   CALCIUM 9.6 05/17/2012 1532   CALCIUM 9.1 11/13/2011 1529   CALCIUM 9.5 11/13/2009 1609   ALKPHOS 126 05/17/2012 1532   ALKPHOS 118* 11/13/2011 1529   ALKPHOS 143* 11/13/2009 1609   AST 20 05/17/2012 1532   AST 28 11/13/2011 1529   AST 26 11/13/2009 1609   ALT 13 05/17/2012 1532   ALT 18 11/13/2009 1609   BILITOT 0.20 05/17/2012 1532   BILITOT 0.50 11/13/2011 1529   BILITOT 0.5 11/13/2009 1609       RADIOGRAPHIC STUDIES: Ct Chest W Contrast  05/17/2012  *RADIOLOGY REPORT*  Clinical Data: Lung cancer in 2009.  Partial left lung resection. No current chest complaints.  CT CHEST WITH CONTRAST  Technique:  Multidetector CT imaging  of the chest was performed following the standard protocol during bolus administration of intravenous contrast.  Contrast: 80mL OMNIPAQUE IOHEXOL 300 MG/ML  SOLN  Comparison: 11/13/2011.  Findings: Heart size is normal.  No pericardial fluid.  Surgical clips left hilar region related to left upper lobectomy.  Small hiatal hernia.  No significant coronary artery calcification. Mild atheromatous change transverse arch.  Conventional branching great vessels.  No hilar, mediastinal, or axillary adenopathy.  Left upper lobectomy.  Moderate emphysema with diffuse bronchial wall thickening.  No visible pulmonary nodules or masses.  No consolidative airspace disease.  No pleural effusion.  Stable left base scarring.  11 x 15 mm left adrenal nodule appears smaller.  No other upper abdominal pathology.  No aggressive appearing lytic or blastic lesions in the visualized portions of the skeleton.  No compression fracture.  IMPRESSION: Stable chest.  No evidence for lung cancer recurrence.  No acute findings.   Original Report Authenticated By: Elsie Stain, M.D.     ASSESSMENT: Pleasant 66 years old [place] American female with history of stage IIIB non-small cell lung cancer status post upper lobectomy with lymph node dissection followed by 4 cycles of adjuvant chemotherapy and she has been observation since May of 2009 with no evidence for disease recurrence.   PLAN: I discussed the scan results with the patient. I recommended for her to continue on observation for now with repeat CT scan of the chest without contrast in 6 months for reevaluation of her disease. Regarding the renal insufficiency she is followed by Dr.Webb. She was advised to call me immediately she has any concerning symptoms in the interval.  All questions were answered. The patient knows to call the clinic with any problems, questions or concerns. We can certainly see the patient much sooner if necessary.

## 2012-05-19 NOTE — Patient Instructions (Signed)
Your scan today showed no evidence for disease recurrence. Will continue on observation with repeat CT scan of the chest in 6 months

## 2012-05-19 NOTE — Telephone Encounter (Signed)
appts made and printed for pt aom °

## 2012-11-15 ENCOUNTER — Ambulatory Visit (HOSPITAL_COMMUNITY)
Admission: RE | Admit: 2012-11-15 | Discharge: 2012-11-15 | Disposition: A | Discharge status: Home / Self Care | Payer: Medicare Other | Source: Ambulatory Visit | Attending: Internal Medicine | Admitting: Internal Medicine

## 2012-11-15 ENCOUNTER — Other Ambulatory Visit (HOSPITAL_BASED_OUTPATIENT_CLINIC_OR_DEPARTMENT_OTHER): Payer: Medicare Other | Admitting: Lab

## 2012-11-15 DIAGNOSIS — J438 Other emphysema: Secondary | ICD-10-CM | POA: Insufficient documentation

## 2012-11-15 DIAGNOSIS — R911 Solitary pulmonary nodule: Secondary | ICD-10-CM | POA: Insufficient documentation

## 2012-11-15 DIAGNOSIS — Z902 Acquired absence of lung [part of]: Secondary | ICD-10-CM | POA: Insufficient documentation

## 2012-11-15 DIAGNOSIS — C3490 Malignant neoplasm of unspecified part of unspecified bronchus or lung: Secondary | ICD-10-CM

## 2012-11-15 DIAGNOSIS — K449 Diaphragmatic hernia without obstruction or gangrene: Secondary | ICD-10-CM | POA: Insufficient documentation

## 2012-11-15 DIAGNOSIS — C3410 Malignant neoplasm of upper lobe, unspecified bronchus or lung: Secondary | ICD-10-CM

## 2012-11-15 LAB — CBC WITH DIFFERENTIAL/PLATELET
BASO%: 0.4 % (ref 0.0–2.0)
EOS%: 4.4 % (ref 0.0–7.0)
MCH: 28.8 pg (ref 25.1–34.0)
MCHC: 33 g/dL (ref 31.5–36.0)
NEUT%: 59 % (ref 38.4–76.8)
RDW: 14.4 % (ref 11.2–14.5)
lymph#: 2.6 10*3/uL (ref 0.9–3.3)

## 2012-11-15 LAB — COMPREHENSIVE METABOLIC PANEL (CC13)
ALT: 16 U/L (ref 0–55)
AST: 18 U/L (ref 5–34)
Albumin: 3.5 g/dL (ref 3.5–5.0)
Calcium: 9.7 mg/dL (ref 8.4–10.4)
Chloride: 102 mEq/L (ref 98–107)
Potassium: 4.2 mEq/L (ref 3.5–5.1)
Total Protein: 7.1 g/dL (ref 6.4–8.3)

## 2012-11-17 ENCOUNTER — Ambulatory Visit (HOSPITAL_BASED_OUTPATIENT_CLINIC_OR_DEPARTMENT_OTHER): Payer: Medicare Other | Admitting: Internal Medicine

## 2012-11-17 ENCOUNTER — Telehealth: Payer: Self-pay | Admitting: Internal Medicine

## 2012-11-17 ENCOUNTER — Encounter: Payer: Self-pay | Admitting: Internal Medicine

## 2012-11-17 VITALS — BP 154/75 | HR 95 | Temp 97.5°F | Resp 20 | Ht 65.5 in | Wt 188.1 lb

## 2012-11-17 DIAGNOSIS — Z85118 Personal history of other malignant neoplasm of bronchus and lung: Secondary | ICD-10-CM

## 2012-11-17 DIAGNOSIS — C3490 Malignant neoplasm of unspecified part of unspecified bronchus or lung: Secondary | ICD-10-CM

## 2012-11-17 NOTE — Patient Instructions (Signed)
No evidence for disease recurrence on the recent scan. Followup visit in one year with repeat CT scan of the chest 

## 2012-11-17 NOTE — Progress Notes (Signed)
[facility] Telephone:([phone]   Fax:([phone]  OFFICE PROGRESS NOTE  Kelly Housekeeper, MD [address] [place] [address]  PRINCIPAL DIAGNOSIS: Stage IIIB (T4, N0, M0) non-small cell lung cancer, adenocarcinoma diagnosed in February 2009.   PRIOR THERAPY:  1. Status post left upper lobectomy with lymph node dissection under the care of Dr. Edwyna Shell on October 21, 2007. 2. Status post 4 cycles of adjuvant chemotherapy with cisplatin and Taxotere. Last dose was given Jan 20, 2008.  CURRENT THERAPY: Observation.  INTERVAL HISTORY: Kelly Herman 67 y.o. female returns to the clinic today for routine six-month followup visit. The patient is feeling fine today with no specific complaints. She denied having any significant chest pain, shortness breath, cough or hemoptysis. She denied having any weight loss or night sweats. She had repeat CT scan of the chest performed recently and she is here for evaluation and discussion of her scan results.  ALLERGIES:  has No Known Allergies.  MEDICATIONS:  Current Outpatient Prescriptions  Medication Sig Dispense Refill  . esomeprazole (NEXIUM) 40 MG capsule Take 40 mg by mouth daily before breakfast.      . FLUoxetine HCl (PROZAC PO) Take by mouth. Pt unsure of dose      . glipiZIDE (GLUCOTROL) 5 MG tablet Take 5 mg by mouth daily.      Marland Kitchen loratadine (CLARITIN) 10 MG tablet Take 10 mg by mouth daily.      Marland Kitchen LORazepam (ATIVAN) 1 MG tablet Take 1 mg by mouth every 6 (six) hours as needed.      Marland Kitchen losartan (COZAAR) 25 MG tablet Take 25 mg by mouth Daily.      . pioglitazone (ACTOS) 30 MG tablet Take 30 mg by mouth daily.      . simvastatin (ZOCOR) 40 MG tablet Take 40 mg by mouth every evening.       No current facility-administered medications for this visit.    REVIEW OF SYSTEMS:  A comprehensive review of systems was negative.   PHYSICAL EXAMINATION: General appearance: alert, cooperative and no  distress Head: Normocephalic, without obvious abnormality, atraumatic Neck: no adenopathy Resp: clear to auscultation bilaterally Cardio: regular rate and rhythm, S1, S2 normal, no murmur, click, rub or gallop GI: soft, non-tender; bowel sounds normal; no masses,  no organomegaly Extremities: extremities normal, atraumatic, no cyanosis or edema  ECOG PERFORMANCE STATUS: 0 - Asymptomatic  Blood pressure 154/75, pulse 95, temperature 97.5 F (36.4 C), temperature source Oral, resp. rate 20, height 5' 5.5" (1.664 m), weight 188 lb 1.6 oz (85.322 kg), SpO2 97.00%.  LABORATORY DATA: Lab Results  Component Value Date   WBC 8.9 11/15/2012   HGB 11.7 11/15/2012   HCT 35.3 11/15/2012   MCV 87.2 11/15/2012   PLT 192 11/15/2012      Chemistry      Component Value Date/Time   NA 138 11/15/2012 1426   NA 141 11/13/2011 1529   NA 140 11/13/2009 1609   K 4.2 11/15/2012 1426   K 4.4 11/13/2011 1529   K 4.2 11/13/2009 1609   CL 102 11/15/2012 1426   CL 96* 11/13/2011 1529   CL 103 11/13/2009 1609   CO2 27 11/15/2012 1426   CO2 29 11/13/2011 1529   CO2 29 11/13/2009 1609   BUN 22.6 11/15/2012 1426   BUN 21 11/13/2011 1529   BUN 23 11/13/2009 1609   CREATININE 1.5* 11/15/2012 1426   CREATININE 1.5* 11/13/2011 1529  CREATININE 1.54* 11/13/2009 1609      Component Value Date/Time   CALCIUM 9.7 11/15/2012 1426   CALCIUM 9.1 11/13/2011 1529   CALCIUM 9.5 11/13/2009 1609   ALKPHOS 141 11/15/2012 1426   ALKPHOS 118* 11/13/2011 1529   ALKPHOS 143* 11/13/2009 1609   AST 18 11/15/2012 1426   AST 28 11/13/2011 1529   AST 26 11/13/2009 1609   ALT 16 11/15/2012 1426   ALT 18 11/13/2009 1609   BILITOT 0.29 11/15/2012 1426   BILITOT 0.50 11/13/2011 1529   BILITOT 0.5 11/13/2009 1609       RADIOGRAPHIC STUDIES: Ct Chest Wo Contrast  11/15/2012  *RADIOLOGY REPORT*  Clinical Data: History of lung cancer.  Restaging scan.  CT CHEST WITHOUT CONTRAST  Technique:  Multidetector CT imaging of the chest was performed following  the standard protocol without IV contrast.  Comparison: Chest CT 05/17/2012.  Findings:  Mediastinum: Heart size is normal. There is no significant pericardial fluid, thickening or pericardial calcification. No pathologically enlarged mediastinal or hilar lymph nodes. Please note that accurate exclusion of hilar adenopathy is limited on noncontrast CT scans.  Small hiatal hernia.  Lungs/Pleura: Status post left upper lobectomy.  Compensatory hyperexpansion of the left lower lobe with a small amount of scarring in the lateral aspect of the left lower lobe which is stable.  There are a few scattered 1-2 mm nodules noted in the periphery of the lungs bilaterally which appear unchanged compared to the prior examination, and are most compatible with areas of chronic mucoid impaction within terminal bronchioles.  No larger more suspicious appearing pulmonary nodules or masses are otherwise identified.  Mild diffuse bronchial wall thickening.  Moderate centrilobular emphysema.  No acute consolidative airspace disease. No pleural effusions.  Upper Abdomen: Atherosclerosis.  Otherwise, unremarkable.  Musculoskeletal: There are no aggressive appearing lytic or blastic lesions noted in the visualized portions of the skeleton.  IMPRESSION: 1.  Status post left upper lobectomy without evidence to suggest local recurrence of disease or new metastatic disease in the thorax. 2.  Mild diffuse bronchial wall thickening with moderate centrilobular emphysema; findings compatible with underlying COPD, as above. 3.  Small hiatal hernia.   Original Report Authenticated By: Trudie Reed, M.D.     ASSESSMENT: This is a very pleasant 67 years old African American female with history of stage IIIB non-small cell lung cancer status post left upper lobectomy followed by 4 cycles of adjuvant chemotherapy with cisplatin and Taxotere. She has been observation since may of 2009 with no evidence for disease recurrence.   PLAN: I discussed  the scan results with the patient today. I recommended for her to continue on observation with repeat CT scan of the chest in one year. She was advised to call me immediately if she has any concerning symptoms in the interval. Advanced directive 4 discussed with the patient today and she indicated a no CODE BLUE if she has any irreversible serious condition.  All questions were answered. The patient knows to call the clinic with any problems, questions or concerns. We can certainly see the patient much sooner if necessary.

## 2013-03-13 ENCOUNTER — Ambulatory Visit (INDEPENDENT_AMBULATORY_CARE_PROVIDER_SITE_OTHER): Payer: Medicare Other | Admitting: Emergency Medicine

## 2013-03-13 VITALS — BP 150/76 | HR 77 | Temp 98.0°F | Resp 18 | Wt 187.0 lb

## 2013-03-13 DIAGNOSIS — S335XXA Sprain of ligaments of lumbar spine, initial encounter: Secondary | ICD-10-CM

## 2013-03-13 MED ORDER — NAPROXEN SODIUM 550 MG PO TABS: 550.0000 mg | ORAL_TABLET | Freq: Two times a day (BID) | ORAL | Status: DC

## 2013-03-13 MED ORDER — CYCLOBENZAPRINE HCL 10 MG PO TABS: 10.0000 mg | ORAL_TABLET | Freq: Three times a day (TID) | ORAL | Status: DC | PRN

## 2013-03-13 NOTE — Patient Instructions (Addendum)

## 2013-03-13 NOTE — Progress Notes (Signed)
[facility] [address] [place] [address] [phone]  Date:  03/13/2013   Name:  Kelly Herman   DOB:  [birth year]   MRN:  [id number]  PCP:  Georgann Housekeeper, MD    Chief Complaint: Back Pain   History of Present Illness:  Kelly Herman is a 67 y.o. very pleasant female patient who presents with the following:  Pain in low back since Wednesday.  Radiates into buttock but no further.  No associated neuro symptoms or weakness.  No history of injury or overuse.  No incontinence.  Is forced to limit usual activity such as golf.  No improvement with over the counter medications or other home remedies.   Denies other complaint or health concern today.   Patient Active Problem List   Diagnosis Date Noted  . Malignant neoplasm of bronchus and lung, unspecified site 11/18/2011    Past Medical History  Diagnosis Date  . Allergy   . Cancer   . GERD (gastroesophageal reflux disease)   . Ulcer   . Diabetes mellitus without complication   . Emphysema of lung     Past Surgical History  Procedure Laterality Date  . Lung surgery      History  Substance Use Topics  . Smoking status: Former Smoker    Quit date: 09/01/2006  . Smokeless tobacco: Not on file  . Alcohol Use: No    No family history on file.  No Known Allergies  Medication list has been reviewed and updated.  Current Outpatient Prescriptions on File Prior to Visit  Medication Sig Dispense Refill  . esomeprazole (NEXIUM) 40 MG capsule Take 40 mg by mouth daily before breakfast.      . FLUoxetine HCl (PROZAC PO) Take by mouth. Pt unsure of dose      . glipiZIDE (GLUCOTROL) 5 MG tablet Take 5 mg by mouth daily.      Marland Kitchen loratadine (CLARITIN) 10 MG tablet Take 10 mg by mouth daily.      Marland Kitchen LORazepam (ATIVAN) 1 MG tablet Take 1 mg by mouth every 6 (six) hours as needed.      Marland Kitchen losartan (COZAAR) 25 MG tablet Take 25 mg by mouth Daily.      . pioglitazone (ACTOS) 30 MG tablet Take 30 mg by  mouth daily.      . simvastatin (ZOCOR) 40 MG tablet Take 40 mg by mouth every evening.       No current facility-administered medications on file prior to visit.    Review of Systems:  As per HPI, otherwise negative.    Physical Examination: Filed Vitals:   03/13/13 1312  BP: 150/76  Pulse: 77  Temp: 98 F (36.7 C)  Resp: 18   Filed Vitals:   03/13/13 1312  Weight: 187 lb (84.823 kg)   Body mass index is 30.63 kg/(m^2). Ideal Body Weight:     GEN: WDWN, NAD, Non-toxic, Alert & Oriented x 3 HEENT: Atraumatic, Normocephalic.  Ears and Nose: No external deformity. EXTR: No clubbing/cyanosis/edema NEURO: Normal gait.  PSYCH: Normally interactive. Conversant. Not depressed or anxious appearing.  Calm demeanor.  BACK:  Benign.  Neuro intact  Assessment and Plan: Lumbosacral strain Anaprox Flexeril   Signed,  Phillips Odor, MD

## 2013-11-15 ENCOUNTER — Other Ambulatory Visit (HOSPITAL_BASED_OUTPATIENT_CLINIC_OR_DEPARTMENT_OTHER): Payer: Medicare Other

## 2013-11-15 ENCOUNTER — Ambulatory Visit (HOSPITAL_COMMUNITY)
Admission: RE | Admit: 2013-11-15 | Discharge: 2013-11-15 | Disposition: A | Discharge status: Home / Self Care | Payer: Medicare Other | Source: Ambulatory Visit | Attending: Internal Medicine | Admitting: Internal Medicine

## 2013-11-15 DIAGNOSIS — J438 Other emphysema: Secondary | ICD-10-CM | POA: Insufficient documentation

## 2013-11-15 DIAGNOSIS — Z902 Acquired absence of lung [part of]: Secondary | ICD-10-CM | POA: Insufficient documentation

## 2013-11-15 DIAGNOSIS — C3490 Malignant neoplasm of unspecified part of unspecified bronchus or lung: Secondary | ICD-10-CM | POA: Insufficient documentation

## 2013-11-15 DIAGNOSIS — R0602 Shortness of breath: Secondary | ICD-10-CM | POA: Insufficient documentation

## 2013-11-15 DIAGNOSIS — I7090 Unspecified atherosclerosis: Secondary | ICD-10-CM | POA: Insufficient documentation

## 2013-11-15 DIAGNOSIS — C3410 Malignant neoplasm of upper lobe, unspecified bronchus or lung: Secondary | ICD-10-CM

## 2013-11-15 LAB — COMPREHENSIVE METABOLIC PANEL (CC13)
ALK PHOS: 131 U/L (ref 40–150)
ALT: 22 U/L (ref 0–55)
ANION GAP: 10 meq/L (ref 3–11)
AST: 21 U/L (ref 5–34)
Albumin: 3.7 g/dL (ref 3.5–5.0)
BILIRUBIN TOTAL: 0.24 mg/dL (ref 0.20–1.20)
BUN: 23.9 mg/dL (ref 7.0–26.0)
CO2: 25 meq/L (ref 22–29)
CREATININE: 1.5 mg/dL — AB (ref 0.6–1.1)
Calcium: 9.7 mg/dL (ref 8.4–10.4)
Chloride: 104 mEq/L (ref 98–109)
GLUCOSE: 124 mg/dL (ref 70–140)
Potassium: 4.3 mEq/L (ref 3.5–5.1)
Sodium: 139 mEq/L (ref 136–145)
Total Protein: 7.1 g/dL (ref 6.4–8.3)

## 2013-11-15 LAB — CBC WITH DIFFERENTIAL/PLATELET
BASO%: 0.5 % (ref 0.0–2.0)
Basophils Absolute: 0 10*3/uL (ref 0.0–0.1)
EOS ABS: 0.4 10*3/uL (ref 0.0–0.5)
EOS%: 5 % (ref 0.0–7.0)
HEMATOCRIT: 35.4 % (ref 34.8–46.6)
HGB: 11.4 g/dL — ABNORMAL LOW (ref 11.6–15.9)
LYMPH%: 37.2 % (ref 14.0–49.7)
MCH: 27.8 pg (ref 25.1–34.0)
MCHC: 32.1 g/dL (ref 31.5–36.0)
MCV: 86.6 fL (ref 79.5–101.0)
MONO#: 0.4 10*3/uL (ref 0.1–0.9)
MONO%: 5.9 % (ref 0.0–14.0)
NEUT#: 3.8 10*3/uL (ref 1.5–6.5)
NEUT%: 51.4 % (ref 38.4–76.8)
PLATELETS: 181 10*3/uL (ref 145–400)
RBC: 4.09 10*6/uL (ref 3.70–5.45)
RDW: 14.8 % — ABNORMAL HIGH (ref 11.2–14.5)
WBC: 7.3 10*3/uL (ref 3.9–10.3)
lymph#: 2.7 10*3/uL (ref 0.9–3.3)

## 2013-11-17 ENCOUNTER — Encounter: Payer: Self-pay | Admitting: Internal Medicine

## 2013-11-17 ENCOUNTER — Telehealth: Payer: Self-pay | Admitting: Internal Medicine

## 2013-11-17 ENCOUNTER — Ambulatory Visit (HOSPITAL_BASED_OUTPATIENT_CLINIC_OR_DEPARTMENT_OTHER): Payer: Medicare Other | Admitting: Internal Medicine

## 2013-11-17 VITALS — BP 172/74 | HR 88 | Temp 98.0°F | Resp 18 | Ht 65.5 in | Wt 188.7 lb

## 2013-11-17 DIAGNOSIS — C3490 Malignant neoplasm of unspecified part of unspecified bronchus or lung: Secondary | ICD-10-CM

## 2013-11-17 DIAGNOSIS — Z85118 Personal history of other malignant neoplasm of bronchus and lung: Secondary | ICD-10-CM

## 2013-11-17 NOTE — Telephone Encounter (Signed)
gv and printed appt sched and avs for pt for March 2016...Marland Kitchenpt insisted on md 2 days after visit

## 2013-11-17 NOTE — Progress Notes (Signed)
Kelly Herman [phone]   Fax:(336) [place] [employer], [address] [place] [address]  PRINCIPAL DIAGNOSIS: Stage IIIB (T4, N0, M0) non-small cell lung cancer, adenocarcinoma diagnosed in February 2009.   PRIOR THERAPY:  1. Status post left upper lobectomy with lymph node dissection under the care of Dr. Arlyce Dice on October 21, 2007. 2. Status post 4 cycles of adjuvant chemotherapy with cisplatin and Taxotere. Last dose was given Jan 20, 2008.  CURRENT THERAPY: Observation.  INTERVAL HISTORY: Kelly Herman 68 y.o. female returns to the clinic today for routine annual followup visit. The patient is feeling fine today with no specific complaints. She denied having any significant chest pain, shortness of breath, cough or hemoptysis. She denied having any weight loss or night sweats. She had repeat CT scan of the chest performed recently and she is here for evaluation and discussion of her scan results.  ALLERGIES:  has No Known Allergies.  MEDICATIONS:  Current Outpatient Prescriptions  Medication Sig Dispense Refill  . esomeprazole (NEXIUM) 40 MG capsule Take 40 mg by mouth daily before breakfast.      . FLUoxetine HCl (PROZAC PO) Take by mouth. Pt unsure of dose      . glipiZIDE (GLUCOTROL) 5 MG tablet Take 5 mg by mouth daily.      Marland Kitchen loratadine (CLARITIN) 10 MG tablet Take 10 mg by mouth daily.      Marland Kitchen LORazepam (ATIVAN) 1 MG tablet Take 1 mg by mouth every 6 (six) hours as needed.      Marland Kitchen losartan (COZAAR) 25 MG tablet Take 25 mg by mouth Daily.      . pioglitazone (ACTOS) 30 MG tablet Take 30 mg by mouth daily.      . simvastatin (ZOCOR) 40 MG tablet Take 40 mg by mouth every evening.       No current facility-administered medications for this visit.    REVIEW OF SYSTEMS:  A comprehensive review of systems was negative.   PHYSICAL EXAMINATION: General appearance: alert, cooperative and no  distress Head: Normocephalic, without obvious abnormality, atraumatic Neck: no adenopathy Resp: clear to auscultation bilaterally Cardio: regular rate and rhythm, S1, S2 normal, no murmur, click, rub or gallop GI: soft, non-tender; bowel sounds normal; no masses,  no organomegaly Extremities: extremities normal, atraumatic, no cyanosis or edema  ECOG PERFORMANCE STATUS: 0 - Asymptomatic  Blood pressure 172/74, pulse 88, temperature 98 F (36.7 C), temperature source Oral, resp. rate 18, height 5' 5.5" (1.664 m), weight 188 lb 11.2 oz (85.594 kg), SpO2 97.00%.  LABORATORY DATA: Lab Results  Component Value Date   WBC 7.3 11/15/2013   HGB 11.4* 11/15/2013   HCT 35.4 11/15/2013   MCV 86.6 11/15/2013   PLT 181 11/15/2013      Chemistry      Component Value Date/Time   NA 139 11/15/2013 1530   NA 141 11/13/2011 1529   NA 140 11/13/2009 1609   K 4.3 11/15/2013 1530   K 4.4 11/13/2011 1529   K 4.2 11/13/2009 1609   CL 102 11/15/2012 1426   CL 96* 11/13/2011 1529   CL 103 11/13/2009 1609   CO2 25 11/15/2013 1530   CO2 29 11/13/2011 1529   CO2 29 11/13/2009 1609   BUN 23.9 11/15/2013 1530   BUN 21 11/13/2011 1529   BUN 23 11/13/2009 1609   CREATININE 1.5* 11/15/2013 1530   CREATININE 1.5* 11/13/2011 1529  CREATININE 1.54* 11/13/2009 1609      Component Value Date/Time   CALCIUM 9.7 11/15/2013 1530   CALCIUM 9.1 11/13/2011 1529   CALCIUM 9.5 11/13/2009 1609   ALKPHOS 131 11/15/2013 1530   ALKPHOS 118* 11/13/2011 1529   ALKPHOS 143* 11/13/2009 1609   AST 21 11/15/2013 1530   AST 28 11/13/2011 1529   AST 26 11/13/2009 1609   ALT 22 11/15/2013 1530   ALT 27 11/13/2011 1529   ALT 18 11/13/2009 1609   BILITOT 0.24 11/15/2013 1530   BILITOT 0.50 11/13/2011 1529   BILITOT 0.5 11/13/2009 1609       RADIOGRAPHIC STUDIES: Ct Chest Wo Contrast  11/15/2012  *RADIOLOGY REPORT*  Clinical Data: History of lung cancer.  Restaging scan.  CT CHEST WITHOUT CONTRAST  Technique:  Multidetector CT imaging of the chest  was performed following the standard protocol without IV contrast.  Comparison: Chest CT 05/17/2012.  Findings:  Mediastinum: Heart size is normal. There is no significant pericardial fluid, thickening or pericardial calcification. No pathologically enlarged mediastinal or hilar lymph nodes. Please note that accurate exclusion of hilar adenopathy is limited on noncontrast CT scans.  Small hiatal hernia.  Lungs/Pleura: Status post left upper lobectomy.  Compensatory hyperexpansion of the left lower lobe with a small amount of scarring in the lateral aspect of the left lower lobe which is stable.  There are a few scattered 1-2 mm nodules noted in the periphery of the lungs bilaterally which appear unchanged compared to the prior examination, and are most compatible with areas of chronic mucoid impaction within terminal bronchioles.  No larger more suspicious appearing pulmonary nodules or masses are otherwise identified.  Mild diffuse bronchial wall thickening.  Moderate centrilobular emphysema.  No acute consolidative airspace disease. No pleural effusions.  Upper Abdomen: Atherosclerosis.  Otherwise, unremarkable.  Musculoskeletal: There are no aggressive appearing lytic or blastic lesions noted in the visualized portions of the skeleton.  IMPRESSION: 1.  Status post left upper lobectomy without evidence to suggest local recurrence of disease or new metastatic disease in the thorax. 2.  Mild diffuse bronchial wall thickening with moderate centrilobular emphysema; findings compatible with underlying COPD, as above. 3.  Small hiatal hernia.   Original Report Authenticated By: Vinnie Langton, M.D.     ASSESSMENT AND PLAN: This is a very pleasant 68 years old African American female with history of stage IIIB non-small cell lung cancer status post left upper lobectomy followed by 4 cycles of adjuvant chemotherapy with cisplatin and Taxotere. She has been observation since may of 2009 with no evidence for disease  recurrence. I discussed the scan results with the patient today. I recommended for her to continue on observation with repeat CT scan of the chest in one year. She was advised to call me immediately if she has any concerning symptoms in the interval.  Advanced directive were discussed with the patient today and she indicated a no CODE BLUE if she has any irreversible serious condition.  All questions were answered. The patient knows to call the clinic with any problems, questions or concerns. We can certainly see the patient much sooner if necessary.  Disclaimer: This note was dictated with voice recognition software. Similar sounding words can inadvertently be transcribed and may not be corrected upon review.

## 2014-11-07 ENCOUNTER — Telehealth: Payer: Self-pay | Admitting: Internal Medicine

## 2014-11-07 NOTE — Telephone Encounter (Signed)
returned pt call and s.w. pt and confirmed appt...pt ok and aware

## 2014-11-13 ENCOUNTER — Other Ambulatory Visit (HOSPITAL_BASED_OUTPATIENT_CLINIC_OR_DEPARTMENT_OTHER): Payer: Medicare Other

## 2014-11-13 ENCOUNTER — Ambulatory Visit (HOSPITAL_COMMUNITY)
Admission: RE | Admit: 2014-11-13 | Discharge: 2014-11-13 | Disposition: A | Discharge status: Home / Self Care | Payer: Medicare Other | Source: Ambulatory Visit | Attending: Internal Medicine | Admitting: Internal Medicine

## 2014-11-13 DIAGNOSIS — C3490 Malignant neoplasm of unspecified part of unspecified bronchus or lung: Secondary | ICD-10-CM

## 2014-11-13 DIAGNOSIS — Z9221 Personal history of antineoplastic chemotherapy: Secondary | ICD-10-CM | POA: Diagnosis not present

## 2014-11-13 DIAGNOSIS — Z85118 Personal history of other malignant neoplasm of bronchus and lung: Secondary | ICD-10-CM

## 2014-11-13 LAB — COMPREHENSIVE METABOLIC PANEL (CC13)
ALT: 20 U/L (ref 0–55)
ANION GAP: 9 meq/L (ref 3–11)
AST: 22 U/L (ref 5–34)
Albumin: 3.7 g/dL (ref 3.5–5.0)
Alkaline Phosphatase: 126 U/L (ref 40–150)
BILIRUBIN TOTAL: 0.24 mg/dL (ref 0.20–1.20)
BUN: 23 mg/dL (ref 7.0–26.0)
CALCIUM: 9.4 mg/dL (ref 8.4–10.4)
CHLORIDE: 106 meq/L (ref 98–109)
CO2: 26 meq/L (ref 22–29)
CREATININE: 1.5 mg/dL — AB (ref 0.6–1.1)
EGFR: 41 mL/min/{1.73_m2} — AB (ref >90)
GLUCOSE: 80 mg/dL (ref 70–140)
Potassium: 4.1 mEq/L (ref 3.5–5.1)
Sodium: 141 mEq/L (ref 136–145)
Total Protein: 7 g/dL (ref 6.4–8.3)

## 2014-11-13 LAB — CBC WITH DIFFERENTIAL/PLATELET
BASO%: 0.1 % (ref 0.0–2.0)
BASOS ABS: 0 10*3/uL (ref 0.0–0.1)
EOS%: 3.5 % (ref 0.0–7.0)
Eosinophils Absolute: 0.2 10*3/uL (ref 0.0–0.5)
HEMATOCRIT: 37.4 % (ref 34.8–46.6)
HEMOGLOBIN: 11.6 g/dL (ref 11.6–15.9)
LYMPH#: 2.6 10*3/uL (ref 0.9–3.3)
LYMPH%: 37.8 % (ref 14.0–49.7)
MCH: 28 pg (ref 25.1–34.0)
MCHC: 31 g/dL — ABNORMAL LOW (ref 31.5–36.0)
MCV: 90.1 fL (ref 79.5–101.0)
MONO#: 0.4 10*3/uL (ref 0.1–0.9)
MONO%: 5.8 % (ref 0.0–14.0)
NEUT#: 3.6 10*3/uL (ref 1.5–6.5)
NEUT%: 52.8 % (ref 38.4–76.8)
PLATELETS: 173 10*3/uL (ref 145–400)
RBC: 4.15 10*6/uL (ref 3.70–5.45)
RDW: 14.6 % — ABNORMAL HIGH (ref 11.2–14.5)
WBC: 6.9 10*3/uL (ref 3.9–10.3)

## 2014-11-15 ENCOUNTER — Telehealth: Payer: Self-pay | Admitting: Internal Medicine

## 2014-11-15 ENCOUNTER — Ambulatory Visit (HOSPITAL_BASED_OUTPATIENT_CLINIC_OR_DEPARTMENT_OTHER): Payer: Medicare Other | Admitting: Internal Medicine

## 2014-11-15 ENCOUNTER — Encounter: Payer: Self-pay | Admitting: Internal Medicine

## 2014-11-15 VITALS — BP 164/67 | HR 82 | Temp 98.1°F | Resp 18 | Ht 65.5 in | Wt 187.7 lb

## 2014-11-15 DIAGNOSIS — C3412 Malignant neoplasm of upper lobe, left bronchus or lung: Secondary | ICD-10-CM

## 2014-11-15 DIAGNOSIS — Z85118 Personal history of other malignant neoplasm of bronchus and lung: Secondary | ICD-10-CM

## 2014-11-15 NOTE — Progress Notes (Signed)
[place] Telephone:([phone]   Fax:(336) [place] [employer], [address] [place]  [phone]  PRINCIPAL DIAGNOSIS: Stage IIIB (T4, N0, M0) non-small cell lung cancer, adenocarcinoma diagnosed in February 2009.   PRIOR THERAPY:  1. Status post left upper lobectomy with lymph node dissection under the care of Dr. Arlyce Dice on October 21, 2007. 2. Status post 4 cycles of adjuvant chemotherapy with cisplatin and Taxotere. Last dose was given Jan 20, 2008.  CURRENT THERAPY: Observation.  INTERVAL HISTORY: Kelly Herman 69 y.o. female returns to the clinic today for routine annual followup visit accompanied by her husband. She has been observation for the last 6 years. The patient is feeling fine today with no specific complaints. She denied having any significant chest pain, shortness of breath, cough or hemoptysis. She denied having any weight loss or night sweats. She had repeat CT scan of the chest performed recently and she is here for evaluation and discussion of her scan results.  ALLERGIES:  has No Known Allergies.  MEDICATIONS:  Current Outpatient Prescriptions  Medication Sig Dispense Refill  . esomeprazole (NEXIUM) 40 MG capsule Take 40 mg by mouth daily before breakfast.    . FLUoxetine HCl (PROZAC PO) Take by mouth. Pt unsure of dose    . glipiZIDE (GLUCOTROL) 5 MG tablet Take 5 mg by mouth daily.    Marland Kitchen loratadine (CLARITIN) 10 MG tablet Take 10 mg by mouth daily.    Marland Kitchen LORazepam (ATIVAN) 1 MG tablet Take 1 mg by mouth every 6 (six) hours as needed.    Marland Kitchen losartan (COZAAR) 25 MG tablet Take 25 mg by mouth Daily.    . pioglitazone (ACTOS) 30 MG tablet Take 30 mg by mouth daily.    . simvastatin (ZOCOR) 40 MG tablet Take 40 mg by mouth every evening.     No current facility-administered medications for this visit.    REVIEW OF SYSTEMS:  A comprehensive review of systems was negative.   PHYSICAL  EXAMINATION: General appearance: alert, cooperative and no distress Head: Normocephalic, without obvious abnormality, atraumatic Neck: no adenopathy Resp: clear to auscultation bilaterally Cardio: regular rate and rhythm, S1, S2 normal, no murmur, click, rub or gallop GI: soft, non-tender; bowel sounds normal; no masses,  no organomegaly Extremities: extremities normal, atraumatic, no cyanosis or edema  ECOG PERFORMANCE STATUS: 0 - Asymptomatic  Blood pressure 164/67, pulse 82, temperature 98.1 F (36.7 C), temperature source Oral, resp. rate 18, height 5' 5.5" (1.664 m), weight 187 lb 11.2 oz (85.14 kg).  LABORATORY DATA: Lab Results  Component Value Date   WBC 6.9 11/13/2014   HGB 11.6 11/13/2014   HCT 37.4 11/13/2014   MCV 90.1 11/13/2014   PLT 173 11/13/2014      Chemistry      Component Value Date/Time   NA 141 11/13/2014 1345   NA 141 11/13/2011 1529   NA 140 11/13/2009 1609   K 4.1 11/13/2014 1345   K 4.4 11/13/2011 1529   K 4.2 11/13/2009 1609   CL 102 11/15/2012 1426   CL 96* 11/13/2011 1529   CL 103 11/13/2009 1609   CO2 26 11/13/2014 1345   CO2 29 11/13/2011 1529   CO2 29 11/13/2009 1609   BUN 23.0 11/13/2014 1345   BUN 21 11/13/2011 1529   BUN 23 11/13/2009 1609   CREATININE 1.5* 11/13/2014 1345   CREATININE 1.5* 11/13/2011 1529   CREATININE 1.54* 11/13/2009  1609      Component Value Date/Time   CALCIUM 9.4 11/13/2014 1345   CALCIUM 9.1 11/13/2011 1529   CALCIUM 9.5 11/13/2009 1609   ALKPHOS 126 11/13/2014 1345   ALKPHOS 118* 11/13/2011 1529   ALKPHOS 143* 11/13/2009 1609   AST 22 11/13/2014 1345   AST 28 11/13/2011 1529   AST 26 11/13/2009 1609   ALT 20 11/13/2014 1345   ALT 27 11/13/2011 1529   ALT 18 11/13/2009 1609   BILITOT 0.24 11/13/2014 1345   BILITOT 0.50 11/13/2011 1529   BILITOT 0.5 11/13/2009 1609       RADIOGRAPHIC STUDIES: Ct Chest Wo Contrast  11/13/2014   CLINICAL DATA:  Restaging lung cancer, completed chemotherapy.  Diagnosed 2009.  EXAM: CT CHEST WITHOUT CONTRAST  TECHNIQUE: Multidetector CT imaging of the chest was performed following the standard protocol without IV contrast.  COMPARISON:  11/15/2013  FINDINGS: Mediastinum/Nodes: Great vessels are normal in caliber.  Mild atheromatous aortic and coronary arterial calcifications are reidentified.  Heart size is normal.  No pericardial effusion.  Allowing for lack of IV contrast which renders visualization of hilar and mediastinal lymph nodes suboptimal, no new evidence for lymphadenopathy.  Evidence of left upper lobectomy without mass lesion identified adjacent to the left perihilar surgical clips.  Lungs/Pleura: Emphysematous changes are reidentified.  Evidence of left upper lobectomy without recurrent pulmonary parenchymal nodule, mass, or consolidation. Curvilinear lingular subpleural scarring is stable. No endobronchial lesion.  Upper abdomen: Small hiatal hernia.  Musculoskeletal: Left posterior sixth rib thoracotomy defect. No new lytic or sclerotic osseous lesion or acute osseous abnormality.  IMPRESSION: Postsurgical change status post left upper lobectomy without CT evidence for disease recurrence or intrathoracic metastatic disease.   Electronically Signed   By: Conchita Paris M.D.   On: 11/13/2014 15:47   ASSESSMENT AND PLAN: This is a very pleasant 68 years old [place] American female with history of stage IIIB non-small cell lung cancer status post left upper lobectomy followed by 4 cycles of adjuvant chemotherapy with cisplatin and Taxotere. She has been observation since may of 2009 with no evidence for disease recurrence. I discussed the scan results with the patient and her husband today. I recommended for her to continue on observation with repeat CT scan of the chest in one year. She was advised to call me immediately if she has any concerning symptoms in the interval.  All questions were answered. The patient knows to call the clinic with any  problems, questions or concerns. We can certainly see the patient much sooner if necessary.  Disclaimer: This note was dictated with voice recognition software. Similar sounding words can inadvertently be transcribed and may not be corrected upon review.

## 2014-11-15 NOTE — Telephone Encounter (Signed)
Gave avs & calendar for March 2017.

## 2015-09-27 ENCOUNTER — Telehealth: Payer: Self-pay | Admitting: Internal Medicine

## 2015-09-27 NOTE — Telephone Encounter (Signed)
Spoke to patient and she is aware of the change to her appointment time due to the provider being on call

## 2015-11-12 ENCOUNTER — Encounter (HOSPITAL_COMMUNITY): Payer: Self-pay

## 2015-11-12 ENCOUNTER — Ambulatory Visit (HOSPITAL_COMMUNITY)
Admission: RE | Admit: 2015-11-12 | Discharge: 2015-11-12 | Disposition: A | Discharge status: Home / Self Care | Payer: Medicare Other | Source: Ambulatory Visit | Attending: Internal Medicine | Admitting: Internal Medicine

## 2015-11-12 ENCOUNTER — Other Ambulatory Visit (HOSPITAL_BASED_OUTPATIENT_CLINIC_OR_DEPARTMENT_OTHER): Payer: Medicare Other

## 2015-11-12 DIAGNOSIS — C3412 Malignant neoplasm of upper lobe, left bronchus or lung: Secondary | ICD-10-CM | POA: Diagnosis not present

## 2015-11-12 DIAGNOSIS — Z85118 Personal history of other malignant neoplasm of bronchus and lung: Secondary | ICD-10-CM | POA: Diagnosis not present

## 2015-11-12 LAB — COMPREHENSIVE METABOLIC PANEL
ALT: 18 U/L (ref 0–55)
AST: 22 U/L (ref 5–34)
Albumin: 3.7 g/dL (ref 3.5–5.0)
Alkaline Phosphatase: 124 U/L (ref 40–150)
Anion Gap: 7 mEq/L (ref 3–11)
BUN: 24.1 mg/dL (ref 7.0–26.0)
CHLORIDE: 104 meq/L (ref 98–109)
CO2: 28 mEq/L (ref 22–29)
Calcium: 9.5 mg/dL (ref 8.4–10.4)
Creatinine: 1.5 mg/dL — ABNORMAL HIGH (ref 0.6–1.1)
EGFR: 40 mL/min/{1.73_m2} — AB (ref >90)
GLUCOSE: 94 mg/dL (ref 70–140)
POTASSIUM: 4.3 meq/L (ref 3.5–5.1)
SODIUM: 139 meq/L (ref 136–145)
Total Bilirubin: <0.3 mg/dL (ref 0.20–1.20)
Total Protein: 7.1 g/dL (ref 6.4–8.3)

## 2015-11-12 LAB — CBC WITH DIFFERENTIAL/PLATELET
BASO%: 0.2 % (ref 0.0–2.0)
Basophils Absolute: 0 10*3/uL (ref 0.0–0.1)
EOS ABS: 0.3 10*3/uL (ref 0.0–0.5)
EOS%: 3.8 % (ref 0.0–7.0)
HCT: 37.3 % (ref 34.8–46.6)
HEMOGLOBIN: 11.8 g/dL (ref 11.6–15.9)
LYMPH%: 36.8 % (ref 14.0–49.7)
MCH: 28.6 pg (ref 25.1–34.0)
MCHC: 31.6 g/dL (ref 31.5–36.0)
MCV: 90.3 fL (ref 79.5–101.0)
MONO#: 0.4 10*3/uL (ref 0.1–0.9)
MONO%: 6.2 % (ref 0.0–14.0)
NEUT%: 53 % (ref 38.4–76.8)
NEUTROS ABS: 3.5 10*3/uL (ref 1.5–6.5)
Platelets: 171 10*3/uL (ref 145–400)
RBC: 4.13 10*6/uL (ref 3.70–5.45)
RDW: 14.5 % (ref 11.2–14.5)
WBC: 6.5 10*3/uL (ref 3.9–10.3)
lymph#: 2.4 10*3/uL (ref 0.9–3.3)

## 2015-11-14 ENCOUNTER — Ambulatory Visit (HOSPITAL_BASED_OUTPATIENT_CLINIC_OR_DEPARTMENT_OTHER): Payer: Medicare Other | Admitting: Internal Medicine

## 2015-11-14 ENCOUNTER — Encounter: Payer: Self-pay | Admitting: Internal Medicine

## 2015-11-14 VITALS — BP 151/64 | HR 81 | Temp 97.7°F | Resp 18 | Ht 65.5 in | Wt 188.1 lb

## 2015-11-14 DIAGNOSIS — Z85118 Personal history of other malignant neoplasm of bronchus and lung: Secondary | ICD-10-CM | POA: Diagnosis not present

## 2015-11-14 DIAGNOSIS — C3412 Malignant neoplasm of upper lobe, left bronchus or lung: Secondary | ICD-10-CM

## 2015-11-14 NOTE — Progress Notes (Signed)
[facility] [phone]   Fax:([phone]) [place] [place] [address]  PRINCIPAL DIAGNOSIS: Stage IIIB (T4, N0, M0) non-small cell lung cancer, adenocarcinoma diagnosed in February 2009.   PRIOR THERAPY:  1. Status post left upper lobectomy with lymph node dissection under the care of Dr. Arlyce Dice on October 21, 2007. 2. Status post 4 cycles of adjuvant chemotherapy with cisplatin and Taxotere. Last dose was given Jan 20, 2008.  CURRENT THERAPY: Observation.  INTERVAL HISTORY: Kelly Herman 70 y.o. female returns to the clinic today for routine annual followup visit. She has been observation for the last 8 years. The patient is feeling fine today with no specific complaints. She denied having any significant chest pain, shortness of breath, cough or hemoptysis. She denied having any weight loss or night sweats. She had repeat CT scan of the chest performed recently and she is here for evaluation and discussion of her scan results.  ALLERGIES:  has No Known Allergies.  MEDICATIONS:  Current Outpatient Prescriptions  Medication Sig Dispense Refill  . esomeprazole (NEXIUM) 40 MG capsule Take 40 mg by mouth daily before breakfast.    . FLUoxetine HCl (PROZAC PO) Take by mouth. Pt unsure of dose    . glipiZIDE (GLUCOTROL) 5 MG tablet Take 5 mg by mouth daily.    Marland Kitchen loratadine (CLARITIN) 10 MG tablet Take 10 mg by mouth daily.    Marland Kitchen LORazepam (ATIVAN) 1 MG tablet Take 1 mg by mouth every 6 (six) hours as needed.    Marland Kitchen losartan (COZAAR) 25 MG tablet Take 25 mg by mouth Daily.    . pioglitazone (ACTOS) 30 MG tablet Take 30 mg by mouth daily.    . simvastatin (ZOCOR) 40 MG tablet Take 40 mg by mouth every evening.     No current facility-administered medications for this visit.    REVIEW OF SYSTEMS:  A comprehensive review of systems was negative.   PHYSICAL EXAMINATION: General appearance:  alert, cooperative and no distress Head: Normocephalic, without obvious abnormality, atraumatic Neck: no adenopathy Resp: clear to auscultation bilaterally Cardio: regular rate and rhythm, S1, S2 normal, no murmur, click, rub or gallop GI: soft, non-tender; bowel sounds normal; no masses,  no organomegaly Extremities: extremities normal, atraumatic, no cyanosis or edema  ECOG PERFORMANCE STATUS: 0 - Asymptomatic  Blood pressure 151/64, pulse 81, temperature 97.7 F (36.5 C), temperature source Oral, resp. rate 18, height 5' 5.5" (1.664 m), weight 188 lb 1.6 oz (85.322 kg), SpO2 97 %.  LABORATORY DATA: Lab Results  Component Value Date   WBC 6.5 11/12/2015   HGB 11.8 11/12/2015   HCT 37.3 11/12/2015   MCV 90.3 11/12/2015   PLT 171 11/12/2015      Chemistry      Component Value Date/Time   NA 139 11/12/2015 1342   NA 141 11/13/2011 1529   NA 140 11/13/2009 1609   K 4.3 11/12/2015 1342   K 4.4 11/13/2011 1529   K 4.2 11/13/2009 1609   CL 102 11/15/2012 1426   CL 96* 11/13/2011 1529   CL 103 11/13/2009 1609   CO2 28 11/12/2015 1342   CO2 29 11/13/2011 1529   CO2 29 11/13/2009 1609   BUN 24.1 11/12/2015 1342   BUN 21 11/13/2011 1529   BUN 23 11/13/2009 1609   CREATININE 1.5* 11/12/2015 1342   CREATININE 1.5* 11/13/2011 1529   CREATININE 1.54* 11/13/2009 1609  Component Value Date/Time   CALCIUM 9.5 11/12/2015 1342   CALCIUM 9.1 11/13/2011 1529   CALCIUM 9.5 11/13/2009 1609   ALKPHOS 124 11/12/2015 1342   ALKPHOS 118* 11/13/2011 1529   ALKPHOS 143* 11/13/2009 1609   AST 22 11/12/2015 1342   AST 28 11/13/2011 1529   AST 26 11/13/2009 1609   ALT 18 11/12/2015 1342   ALT 27 11/13/2011 1529   ALT 18 11/13/2009 1609   BILITOT <0.30 11/12/2015 1342   BILITOT 0.50 11/13/2011 1529   BILITOT 0.5 11/13/2009 1609       RADIOGRAPHIC STUDIES: Ct Chest Wo Contrast  11/12/2015  CLINICAL DATA:  Lung cancer, chemotherapy complete. EXAM: CT CHEST WITHOUT CONTRAST  TECHNIQUE: Multidetector CT imaging of the chest was performed following the standard protocol without IV contrast. COMPARISON:  11/13/2014. FINDINGS: Mediastinum/Nodes: No pathologically enlarged mediastinal or axillary lymph nodes. Hilar regions are difficult to definitively evaluate without IV contrast. Heart size normal. No pericardial effusion. Small hiatal hernia. Lungs/Pleura: Moderate centrilobular emphysema. 3 mm right middle lobe nodule (5/29), stable. Peribronchovascular ground-glass nodularity in the right lower lobe (5/38), also stable. Left upper lobectomy. Subpleural nodular and linear densities in the left lower lobe are unchanged. No pleural fluid. Airway is otherwise unremarkable. Upper abdomen: Visualized portions of the liver and right adrenal gland are unremarkable. 1.5 cm nodule in the lateral limb left adrenal gland measures 23 Hounsfield but is unchanged from 11/18/2010, indicative of an adenoma. Visualized portions of the kidneys, spleen, pancreas, stomach and bowel are grossly unremarkable with exception of a small hiatal hernia. No upper abdominal adenopathy. Musculoskeletal: No worrisome lytic or sclerotic lesions. Degenerative changes are seen in the spine. IMPRESSION: Postoperative changes of left upper lobectomy without evidence of recurrent or metastatic disease. Electronically Signed   By: Lorin Picket M.D.   On: 11/12/2015 16:53   ASSESSMENT AND PLAN: This is a very pleasant 70 years old [place] American female with history of stage IIIB non-small cell lung cancer status post left upper lobectomy followed by 4 cycles of adjuvant chemotherapy with cisplatin and Taxotere. She has been observation since may of 2009 with no evidence for disease recurrence. The recent CT scan of the chest showed no finding concerning for disease recurrence. I discussed the scan results with the patient today. I recommended for her to continue on observation with routine follow-up visit with his  primary care physician at this point.  I would see the patient on as needed basis. She was advised to call me immediately if she has any concerning symptoms. She agreed to the current plan. All questions were answered. The patient knows to call the clinic with any problems, questions or concerns. We can certainly see the patient much sooner if necessary.  Disclaimer: This note was dictated with voice recognition software. Similar sounding words can inadvertently be transcribed and may not be corrected upon review.

## 2016-01-14 ENCOUNTER — Other Ambulatory Visit: Payer: Self-pay | Admitting: Internal Medicine

## 2016-01-14 DIAGNOSIS — K228 Other specified diseases of esophagus: Secondary | ICD-10-CM

## 2016-01-14 DIAGNOSIS — K2289 Other specified disease of esophagus: Secondary | ICD-10-CM

## 2016-01-14 DIAGNOSIS — K219 Gastro-esophageal reflux disease without esophagitis: Secondary | ICD-10-CM

## 2016-01-18 ENCOUNTER — Ambulatory Visit
Admission: RE | Admit: 2016-01-18 | Discharge: 2016-01-18 | Disposition: A | Discharge status: Home / Self Care | Payer: Medicare Other | Source: Ambulatory Visit | Attending: Internal Medicine | Admitting: Internal Medicine

## 2016-01-18 DIAGNOSIS — K2289 Other specified disease of esophagus: Secondary | ICD-10-CM

## 2016-01-18 DIAGNOSIS — K219 Gastro-esophageal reflux disease without esophagitis: Secondary | ICD-10-CM

## 2016-01-18 DIAGNOSIS — K228 Other specified diseases of esophagus: Secondary | ICD-10-CM

## 2016-12-29 ENCOUNTER — Ambulatory Visit
Admission: RE | Admit: 2016-12-29 | Discharge: 2016-12-29 | Disposition: A | Discharge status: Home / Self Care | Payer: Medicare Other | Source: Ambulatory Visit | Attending: Nurse Practitioner | Admitting: Nurse Practitioner

## 2016-12-29 ENCOUNTER — Other Ambulatory Visit: Payer: Self-pay | Admitting: Nurse Practitioner

## 2016-12-29 DIAGNOSIS — R042 Hemoptysis: Secondary | ICD-10-CM

## 2016-12-29 MED ORDER — IOPAMIDOL (ISOVUE-300) INJECTION 61%
60.0000 mL | Freq: Once | INTRAVENOUS | Status: AC | PRN
  Administered 2016-12-29: 60 mL via INTRAVENOUS

## 2018-04-05 ENCOUNTER — Other Ambulatory Visit: Payer: Self-pay | Admitting: Internal Medicine

## 2018-04-05 ENCOUNTER — Ambulatory Visit
Admission: RE | Admit: 2018-04-05 | Discharge: 2018-04-05 | Disposition: A | Discharge status: Home / Self Care | Payer: Medicare Other | Source: Ambulatory Visit | Attending: Internal Medicine | Admitting: Internal Medicine

## 2018-04-05 DIAGNOSIS — C3412 Malignant neoplasm of upper lobe, left bronchus or lung: Secondary | ICD-10-CM

## 2019-10-13 ENCOUNTER — Ambulatory Visit
Admission: RE | Admit: 2019-10-13 | Discharge: 2019-10-13 | Disposition: A | Discharge status: Home / Self Care | Payer: Medicare Other | Source: Ambulatory Visit | Attending: Internal Medicine | Admitting: Internal Medicine

## 2019-10-13 ENCOUNTER — Other Ambulatory Visit: Payer: Self-pay | Admitting: Internal Medicine

## 2019-10-13 DIAGNOSIS — Z09 Encounter for follow-up examination after completed treatment for conditions other than malignant neoplasm: Secondary | ICD-10-CM

## 2019-11-03 ENCOUNTER — Ambulatory Visit: Payer: Medicare Other | Attending: Internal Medicine

## 2019-11-03 DIAGNOSIS — Z23 Encounter for immunization: Secondary | ICD-10-CM

## 2019-11-03 NOTE — Progress Notes (Signed)
   Covid-19 Vaccination Clinic  Name:  Kelly Herman    MRN: [id number] DOB: 13-Jul-1946  11/03/2019  [name] was observed post Covid-19 immunization for 15 minutes without incident. She was provided with Vaccine Information Sheet and instruction to access the V-Safe system.   [name] was instructed to call 911 with any severe reactions post vaccine: Marland Kitchen Difficulty breathing  . Swelling of face and throat  . A fast heartbeat  . A bad rash all over body  . Dizziness and weakness

## 2019-11-29 ENCOUNTER — Ambulatory Visit: Payer: Medicare Other | Attending: Internal Medicine

## 2019-11-29 DIAGNOSIS — Z23 Encounter for immunization: Secondary | ICD-10-CM

## 2019-11-29 NOTE — Progress Notes (Signed)
   Covid-19 Vaccination Clinic  Name:  Kelly Herman    MRN: [id number] DOB: Dec 05, 1945  11/29/2019  Kelly Herman was observed post Covid-19 immunization for 15 minutes without incident. She was provided with Vaccine Information Sheet and instruction to access the V-Safe system.   Kelly Herman was instructed to call 911 with any severe reactions post vaccine: Marland Kitchen Difficulty breathing  . Swelling of face and throat  . A fast heartbeat  . A bad rash all over body  . Dizziness and weakness   Immunizations Administered    Name Date Dose VIS Date Route   Pfizer COVID-19 Vaccine 11/29/2019  2:23 PM 0.3 mL 08/12/2019 Intramuscular   Manufacturer: [employer], [employer]   Lot: [id number]   [place]: [id number]

## 2020-10-15 DIAGNOSIS — J449 Chronic obstructive pulmonary disease, unspecified: Secondary | ICD-10-CM | POA: Diagnosis not present

## 2020-10-15 DIAGNOSIS — E7800 Pure hypercholesterolemia, unspecified: Secondary | ICD-10-CM | POA: Diagnosis not present

## 2020-10-15 DIAGNOSIS — Z7984 Long term (current) use of oral hypoglycemic drugs: Secondary | ICD-10-CM | POA: Diagnosis not present

## 2020-10-15 DIAGNOSIS — I700 Atherosclerosis of aorta: Secondary | ICD-10-CM | POA: Diagnosis not present

## 2020-10-15 DIAGNOSIS — K279 Peptic ulcer, site unspecified, unspecified as acute or chronic, without hemorrhage or perforation: Secondary | ICD-10-CM | POA: Diagnosis not present

## 2020-10-15 DIAGNOSIS — N183 Chronic kidney disease, stage 3 unspecified: Secondary | ICD-10-CM | POA: Diagnosis not present

## 2020-10-15 DIAGNOSIS — E119 Type 2 diabetes mellitus without complications: Secondary | ICD-10-CM | POA: Diagnosis not present

## 2020-10-15 DIAGNOSIS — I10 Essential (primary) hypertension: Secondary | ICD-10-CM | POA: Diagnosis not present

## 2020-10-15 DIAGNOSIS — Z85118 Personal history of other malignant neoplasm of bronchus and lung: Secondary | ICD-10-CM | POA: Diagnosis not present

## 2020-10-15 DIAGNOSIS — G620 Drug-induced polyneuropathy: Secondary | ICD-10-CM | POA: Diagnosis not present

## 2020-10-15 DIAGNOSIS — E1122 Type 2 diabetes mellitus with diabetic chronic kidney disease: Secondary | ICD-10-CM | POA: Diagnosis not present

## 2020-12-05 ENCOUNTER — Other Ambulatory Visit: Payer: Self-pay | Admitting: Internal Medicine

## 2020-12-05 DIAGNOSIS — R1013 Epigastric pain: Secondary | ICD-10-CM

## 2020-12-19 ENCOUNTER — Ambulatory Visit
Admission: RE | Admit: 2020-12-19 | Discharge: 2020-12-19 | Disposition: A | Discharge status: Home / Self Care | Payer: Medicare Other | Source: Ambulatory Visit | Attending: Internal Medicine | Admitting: Internal Medicine

## 2020-12-19 DIAGNOSIS — K219 Gastro-esophageal reflux disease without esophagitis: Secondary | ICD-10-CM | POA: Diagnosis not present

## 2020-12-19 DIAGNOSIS — R1013 Epigastric pain: Secondary | ICD-10-CM

## 2020-12-19 DIAGNOSIS — K224 Dyskinesia of esophagus: Secondary | ICD-10-CM | POA: Diagnosis not present

## 2021-01-24 DIAGNOSIS — R1310 Dysphagia, unspecified: Secondary | ICD-10-CM | POA: Diagnosis not present

## 2021-01-24 DIAGNOSIS — R12 Heartburn: Secondary | ICD-10-CM | POA: Diagnosis not present

## 2021-01-24 DIAGNOSIS — R1013 Epigastric pain: Secondary | ICD-10-CM | POA: Diagnosis not present

## 2021-01-24 DIAGNOSIS — K219 Gastro-esophageal reflux disease without esophagitis: Secondary | ICD-10-CM | POA: Diagnosis not present

## 2021-02-13 DIAGNOSIS — R748 Abnormal levels of other serum enzymes: Secondary | ICD-10-CM | POA: Diagnosis not present

## 2021-02-26 DIAGNOSIS — R1310 Dysphagia, unspecified: Secondary | ICD-10-CM | POA: Diagnosis not present

## 2021-02-26 DIAGNOSIS — K2900 Acute gastritis without bleeding: Secondary | ICD-10-CM | POA: Diagnosis not present

## 2021-02-26 DIAGNOSIS — K449 Diaphragmatic hernia without obstruction or gangrene: Secondary | ICD-10-CM | POA: Diagnosis not present

## 2021-02-26 DIAGNOSIS — K219 Gastro-esophageal reflux disease without esophagitis: Secondary | ICD-10-CM | POA: Diagnosis not present

## 2021-03-26 DIAGNOSIS — Z1231 Encounter for screening mammogram for malignant neoplasm of breast: Secondary | ICD-10-CM | POA: Diagnosis not present

## 2021-04-18 DIAGNOSIS — Z0000 Encounter for general adult medical examination without abnormal findings: Secondary | ICD-10-CM | POA: Diagnosis not present

## 2021-04-18 DIAGNOSIS — J449 Chronic obstructive pulmonary disease, unspecified: Secondary | ICD-10-CM | POA: Diagnosis not present

## 2021-04-18 DIAGNOSIS — I700 Atherosclerosis of aorta: Secondary | ICD-10-CM | POA: Diagnosis not present

## 2021-04-18 DIAGNOSIS — E1122 Type 2 diabetes mellitus with diabetic chronic kidney disease: Secondary | ICD-10-CM | POA: Diagnosis not present

## 2021-04-18 DIAGNOSIS — E7800 Pure hypercholesterolemia, unspecified: Secondary | ICD-10-CM | POA: Diagnosis not present

## 2021-04-18 DIAGNOSIS — R748 Abnormal levels of other serum enzymes: Secondary | ICD-10-CM | POA: Diagnosis not present

## 2021-04-18 DIAGNOSIS — K219 Gastro-esophageal reflux disease without esophagitis: Secondary | ICD-10-CM | POA: Diagnosis not present

## 2021-04-18 DIAGNOSIS — Z85118 Personal history of other malignant neoplasm of bronchus and lung: Secondary | ICD-10-CM | POA: Diagnosis not present

## 2021-04-18 DIAGNOSIS — G620 Drug-induced polyneuropathy: Secondary | ICD-10-CM | POA: Diagnosis not present

## 2021-04-18 DIAGNOSIS — I10 Essential (primary) hypertension: Secondary | ICD-10-CM | POA: Diagnosis not present

## 2021-04-18 DIAGNOSIS — Z1389 Encounter for screening for other disorder: Secondary | ICD-10-CM | POA: Diagnosis not present

## 2021-04-18 DIAGNOSIS — N183 Chronic kidney disease, stage 3 unspecified: Secondary | ICD-10-CM | POA: Diagnosis not present

## 2021-09-16 DIAGNOSIS — M545 Low back pain, unspecified: Secondary | ICD-10-CM | POA: Diagnosis not present

## 2021-09-16 DIAGNOSIS — M25512 Pain in left shoulder: Secondary | ICD-10-CM | POA: Diagnosis not present

## 2021-09-16 DIAGNOSIS — M25511 Pain in right shoulder: Secondary | ICD-10-CM | POA: Diagnosis not present

## 2021-09-23 DIAGNOSIS — M7542 Impingement syndrome of left shoulder: Secondary | ICD-10-CM | POA: Diagnosis not present

## 2021-09-23 DIAGNOSIS — M5416 Radiculopathy, lumbar region: Secondary | ICD-10-CM | POA: Diagnosis not present

## 2021-09-23 DIAGNOSIS — M7541 Impingement syndrome of right shoulder: Secondary | ICD-10-CM | POA: Diagnosis not present

## 2021-09-24 DIAGNOSIS — E119 Type 2 diabetes mellitus without complications: Secondary | ICD-10-CM | POA: Diagnosis not present

## 2021-10-15 ENCOUNTER — Other Ambulatory Visit: Payer: Self-pay

## 2021-10-15 ENCOUNTER — Other Ambulatory Visit: Payer: Self-pay | Admitting: Internal Medicine

## 2021-10-15 ENCOUNTER — Ambulatory Visit
Admission: RE | Admit: 2021-10-15 | Discharge: 2021-10-15 | Disposition: A | Discharge status: Home / Self Care | Payer: Medicare Other | Source: Ambulatory Visit | Attending: Internal Medicine | Admitting: Internal Medicine

## 2021-10-15 DIAGNOSIS — J449 Chronic obstructive pulmonary disease, unspecified: Secondary | ICD-10-CM | POA: Diagnosis not present

## 2021-10-15 DIAGNOSIS — Z85118 Personal history of other malignant neoplasm of bronchus and lung: Secondary | ICD-10-CM | POA: Diagnosis not present

## 2021-10-15 DIAGNOSIS — E1122 Type 2 diabetes mellitus with diabetic chronic kidney disease: Secondary | ICD-10-CM | POA: Diagnosis not present

## 2021-10-15 DIAGNOSIS — K219 Gastro-esophageal reflux disease without esophagitis: Secondary | ICD-10-CM | POA: Diagnosis not present

## 2021-10-15 DIAGNOSIS — E7800 Pure hypercholesterolemia, unspecified: Secondary | ICD-10-CM | POA: Diagnosis not present

## 2021-10-15 DIAGNOSIS — N183 Chronic kidney disease, stage 3 unspecified: Secondary | ICD-10-CM | POA: Diagnosis not present

## 2021-10-15 DIAGNOSIS — I10 Essential (primary) hypertension: Secondary | ICD-10-CM | POA: Diagnosis not present

## 2021-10-15 DIAGNOSIS — I700 Atherosclerosis of aorta: Secondary | ICD-10-CM | POA: Diagnosis not present

## 2021-10-15 DIAGNOSIS — L42 Pityriasis rosea: Secondary | ICD-10-CM | POA: Diagnosis not present

## 2021-10-15 DIAGNOSIS — J439 Emphysema, unspecified: Secondary | ICD-10-CM | POA: Diagnosis not present

## 2021-10-24 DIAGNOSIS — R748 Abnormal levels of other serum enzymes: Secondary | ICD-10-CM | POA: Diagnosis not present

## 2021-11-21 DIAGNOSIS — R748 Abnormal levels of other serum enzymes: Secondary | ICD-10-CM | POA: Diagnosis not present

## 2021-12-05 ENCOUNTER — Other Ambulatory Visit: Payer: Self-pay | Admitting: Internal Medicine

## 2021-12-05 ENCOUNTER — Other Ambulatory Visit (HOSPITAL_COMMUNITY): Payer: Self-pay | Admitting: Internal Medicine

## 2021-12-05 DIAGNOSIS — R748 Abnormal levels of other serum enzymes: Secondary | ICD-10-CM

## 2021-12-16 ENCOUNTER — Ambulatory Visit (HOSPITAL_COMMUNITY)
Admission: RE | Admit: 2021-12-16 | Discharge: 2021-12-16 | Disposition: A | Discharge status: Home / Self Care | Payer: Medicare Other | Source: Ambulatory Visit | Attending: Internal Medicine | Admitting: Internal Medicine

## 2021-12-16 ENCOUNTER — Encounter (HOSPITAL_COMMUNITY)
Admission: RE | Admit: 2021-12-16 | Discharge: 2021-12-16 | Disposition: A | Discharge status: Home / Self Care | Payer: Medicare Other | Source: Ambulatory Visit | Attending: Internal Medicine | Admitting: Internal Medicine

## 2021-12-16 DIAGNOSIS — R748 Abnormal levels of other serum enzymes: Secondary | ICD-10-CM | POA: Insufficient documentation

## 2021-12-16 DIAGNOSIS — C3490 Malignant neoplasm of unspecified part of unspecified bronchus or lung: Secondary | ICD-10-CM | POA: Diagnosis not present

## 2021-12-16 DIAGNOSIS — R296 Repeated falls: Secondary | ICD-10-CM | POA: Diagnosis not present

## 2021-12-16 DIAGNOSIS — M25512 Pain in left shoulder: Secondary | ICD-10-CM | POA: Diagnosis not present

## 2021-12-16 MED ORDER — TECHNETIUM TC 99M MEDRONATE IV KIT
20.0000 | PACK | Freq: Once | INTRAVENOUS | Status: AC | PRN
  Administered 2021-12-16: 20 via INTRAVENOUS

## 2021-12-25 DIAGNOSIS — I10 Essential (primary) hypertension: Secondary | ICD-10-CM | POA: Diagnosis not present

## 2021-12-25 DIAGNOSIS — K219 Gastro-esophageal reflux disease without esophagitis: Secondary | ICD-10-CM | POA: Diagnosis not present

## 2021-12-25 DIAGNOSIS — N183 Chronic kidney disease, stage 3 unspecified: Secondary | ICD-10-CM | POA: Diagnosis not present

## 2022-01-15 DIAGNOSIS — K219 Gastro-esophageal reflux disease without esophagitis: Secondary | ICD-10-CM | POA: Diagnosis not present

## 2022-01-15 DIAGNOSIS — R1013 Epigastric pain: Secondary | ICD-10-CM | POA: Diagnosis not present

## 2022-01-16 ENCOUNTER — Other Ambulatory Visit: Payer: Self-pay | Admitting: Internal Medicine

## 2022-01-16 DIAGNOSIS — R1013 Epigastric pain: Secondary | ICD-10-CM

## 2022-01-17 ENCOUNTER — Ambulatory Visit
Admission: RE | Admit: 2022-01-17 | Discharge: 2022-01-17 | Disposition: A | Discharge status: Home / Self Care | Payer: Medicare Other | Source: Ambulatory Visit | Attending: Internal Medicine | Admitting: Internal Medicine

## 2022-01-17 DIAGNOSIS — R1013 Epigastric pain: Secondary | ICD-10-CM

## 2022-01-17 DIAGNOSIS — I700 Atherosclerosis of aorta: Secondary | ICD-10-CM | POA: Diagnosis not present

## 2022-02-06 ENCOUNTER — Encounter (HOSPITAL_COMMUNITY): Payer: Self-pay | Admitting: Emergency Medicine

## 2022-02-06 ENCOUNTER — Emergency Department (HOSPITAL_COMMUNITY): Payer: Medicare Other

## 2022-02-06 ENCOUNTER — Other Ambulatory Visit: Payer: Self-pay

## 2022-02-06 ENCOUNTER — Emergency Department (HOSPITAL_COMMUNITY)
Admission: EM | Admit: 2022-02-06 | Discharge: 2022-02-06 | Disposition: A | Discharge status: Home / Self Care | Payer: Medicare Other | Attending: Emergency Medicine | Admitting: Emergency Medicine

## 2022-02-06 DIAGNOSIS — R1013 Epigastric pain: Secondary | ICD-10-CM | POA: Diagnosis not present

## 2022-02-06 DIAGNOSIS — J449 Chronic obstructive pulmonary disease, unspecified: Secondary | ICD-10-CM | POA: Insufficient documentation

## 2022-02-06 DIAGNOSIS — E119 Type 2 diabetes mellitus without complications: Secondary | ICD-10-CM | POA: Insufficient documentation

## 2022-02-06 DIAGNOSIS — E860 Dehydration: Secondary | ICD-10-CM | POA: Diagnosis not present

## 2022-02-06 DIAGNOSIS — R112 Nausea with vomiting, unspecified: Secondary | ICD-10-CM | POA: Insufficient documentation

## 2022-02-06 DIAGNOSIS — Z85118 Personal history of other malignant neoplasm of bronchus and lung: Secondary | ICD-10-CM | POA: Insufficient documentation

## 2022-02-06 DIAGNOSIS — R109 Unspecified abdominal pain: Secondary | ICD-10-CM | POA: Diagnosis not present

## 2022-02-06 DIAGNOSIS — Z7951 Long term (current) use of inhaled steroids: Secondary | ICD-10-CM | POA: Insufficient documentation

## 2022-02-06 DIAGNOSIS — Z7984 Long term (current) use of oral hypoglycemic drugs: Secondary | ICD-10-CM | POA: Insufficient documentation

## 2022-02-06 LAB — CBC
HCT: 39.6 % (ref 36.0–46.0)
Hemoglobin: 12.7 g/dL (ref 12.0–15.0)
MCH: 29.4 pg (ref 26.0–34.0)
MCHC: 32.1 g/dL (ref 30.0–36.0)
MCV: 91.7 fL (ref 80.0–100.0)
Platelets: 209 10*3/uL (ref 150–400)
RBC: 4.32 MIL/uL (ref 3.87–5.11)
RDW: 12.9 % (ref 11.5–15.5)
WBC: 9.4 10*3/uL (ref 4.0–10.5)
nRBC: 0 % (ref 0.0–0.2)

## 2022-02-06 LAB — URINALYSIS, ROUTINE W REFLEX MICROSCOPIC
Bacteria, UA: NONE SEEN
Bilirubin Urine: NEGATIVE
Glucose, UA: NEGATIVE mg/dL
Hgb urine dipstick: NEGATIVE
Ketones, ur: NEGATIVE mg/dL
Nitrite: NEGATIVE
Protein, ur: NEGATIVE mg/dL
Specific Gravity, Urine: 1.015 (ref 1.005–1.030)
pH: 7 (ref 5.0–8.0)

## 2022-02-06 LAB — COMPREHENSIVE METABOLIC PANEL
ALT: 14 U/L (ref 0–44)
AST: 24 U/L (ref 15–41)
Albumin: 3.6 g/dL (ref 3.5–5.0)
Alkaline Phosphatase: 164 U/L — ABNORMAL HIGH (ref 38–126)
Anion gap: 11 (ref 5–15)
BUN: 20 mg/dL (ref 8–23)
CO2: 25 mmol/L (ref 22–32)
Calcium: 9.4 mg/dL (ref 8.9–10.3)
Chloride: 99 mmol/L (ref 98–111)
Creatinine, Ser: 1.32 mg/dL — ABNORMAL HIGH (ref 0.44–1.00)
GFR, Estimated: 42 mL/min — ABNORMAL LOW (ref >60)
Glucose, Bld: 176 mg/dL — ABNORMAL HIGH (ref 70–99)
Potassium: 4.4 mmol/L (ref 3.5–5.1)
Sodium: 135 mmol/L (ref 135–145)
Total Bilirubin: 0.5 mg/dL (ref 0.3–1.2)
Total Protein: 6.9 g/dL (ref 6.5–8.1)

## 2022-02-06 LAB — LIPASE, BLOOD: Lipase: 24 U/L (ref 11–51)

## 2022-02-06 MED ORDER — ONDANSETRON HCL 4 MG/2ML IJ SOLN
4.0000 mg | Freq: Once | INTRAMUSCULAR | Status: AC
  Administered 2022-02-06: 4 mg via INTRAVENOUS
  Filled 2022-02-06: qty 2

## 2022-02-06 MED ORDER — SODIUM CHLORIDE 0.9 % IV BOLUS
1000.0000 mL | Freq: Once | INTRAVENOUS | Status: AC
  Administered 2022-02-06: 1000 mL via INTRAVENOUS

## 2022-02-06 MED ORDER — DICYCLOMINE HCL 20 MG PO TABS: 20.0000 mg | ORAL_TABLET | Freq: Two times a day (BID) | ORAL | 0 refills | Status: DC

## 2022-02-06 MED ORDER — ONDANSETRON 4 MG PO TBDP: 4.0000 mg | ORAL_TABLET | Freq: Three times a day (TID) | ORAL | 0 refills | Status: DC | PRN

## 2022-02-06 MED ORDER — IOHEXOL 300 MG/ML  SOLN
100.0000 mL | Freq: Once | INTRAMUSCULAR | Status: AC | PRN
  Administered 2022-02-06: 80 mL via INTRAVENOUS

## 2022-02-06 MED ORDER — MORPHINE SULFATE (PF) 4 MG/ML IV SOLN
4.0000 mg | Freq: Once | INTRAVENOUS | Status: AC
  Administered 2022-02-06: 4 mg via INTRAVENOUS
  Filled 2022-02-06: qty 1

## 2022-02-06 NOTE — ED Notes (Signed)
Patient transported to CT 

## 2022-02-06 NOTE — ED Notes (Signed)
Pt assisted to the restroom to provide samples

## 2022-02-06 NOTE — ED Provider Notes (Signed)
[facility] EMERGENCY DEPARTMENT Provider Note   CSN: [id number] Arrival date & time: 02/06/22  3810     History  Chief Complaint  Patient presents with   Vomiting   Abdominal Pain    Kelly Herman is a 76 y.o. female.  Pt is a 76 yo female with a pmhx significant for a hiatal hernia, GERD, PUD, DM, COPD, lung cancer, and anxiety.  Pt has had abdominal pain on and off for years.  She is followed by [facility].  Her PPI was just changed to Nexium to see if that would help more.  She last saw [facility] on 6/5 for the abd pain.  Pt said she developed worsening of the epigastric pain with n/v yesterday.  She feels a little better now, but not back to baseline.  She wants to stop hurting all the time.  She said that if I need to admit her, that is ok.       Home Medications Prior to Admission medications   Medication Sig Start Date End Date Taking? Authorizing Provider  dicyclomine (BENTYL) 20 MG tablet Take 1 tablet (20 mg total) by mouth 2 (two) times daily. 02/06/22  Yes Isla Pence, MD  ondansetron (ZOFRAN-ODT) 4 MG disintegrating tablet Take 1 tablet (4 mg total) by mouth every 8 (eight) hours as needed for nausea or vomiting. 02/06/22  Yes Isla Pence, MD  amLODipine (NORVASC) 5 MG tablet Take 5 mg by mouth daily. 01/13/22   [provider]  atorvastatin (LIPITOR) 20 MG tablet Take 20 mg by mouth daily. 12/08/21   [provider]  esomeprazole (NEXIUM) 40 MG capsule Take 40 mg by mouth daily before breakfast.    [provider]  famotidine (PEPCID) 20 MG tablet Take 20 mg by mouth at bedtime. 12/25/21   [provider]  FLUoxetine (PROZAC) 20 MG capsule Take 20 mg by mouth daily. 01/25/22   [provider]  glipiZIDE (GLUCOTROL) 5 MG tablet Take 5 mg by mouth 2 (two) times daily.    [provider]  loratadine (CLARITIN) 10 MG tablet Take 10 mg by mouth daily.    [provider]  LORazepam (ATIVAN) 0.5 MG  tablet Take 0.5 mg by mouth daily as needed for anxiety. 01/24/22   [provider]  losartan (COZAAR) 50 MG tablet Take 50 mg by mouth daily. 11/25/21   [provider]  pantoprazole (PROTONIX) 40 MG tablet Take 40 mg by mouth every morning. 11/15/21   [provider]  pioglitazone (ACTOS) 30 MG tablet Take 30 mg by mouth daily.    [provider]  PROAIR RESPICLICK 175 (90 Base) MCG/ACT AEPB Inhale 2 puffs into the lungs every 6 (six) hours as needed for shortness of breath. 01/15/22   [provider]      Allergies    Angiotensin receptor blockers, Metformin and related, Nsaids, and Tramadol    Review of Systems   Review of Systems  Gastrointestinal:  Positive for abdominal pain, diarrhea, nausea and vomiting.  All other systems reviewed and are negative.   Physical Exam Updated Vital Signs BP (!) 151/75   Pulse 75   Temp 98.9 F (37.2 C) (Oral)   Resp 18   Ht 5' 5.5" (1.664 m)   Wt 78 kg   SpO2 96%   BMI 28.19 kg/m  Physical Exam Vitals and nursing note reviewed.  Constitutional:      Appearance: She is well-developed.  HENT:  Head: Normocephalic and atraumatic.     Mouth/Throat:     Mouth: Mucous membranes are moist.     Pharynx: Oropharynx is clear.  Eyes:     Extraocular Movements: Extraocular movements intact.     Pupils: Pupils are equal, round, and reactive to light.  Cardiovascular:     Rate and Rhythm: Normal rate and regular rhythm.  Pulmonary:     Effort: Pulmonary effort is normal.     Breath sounds: Normal breath sounds.  Abdominal:     General: Abdomen is flat. Bowel sounds are normal.     Palpations: Abdomen is soft.     Tenderness: There is abdominal tenderness in the epigastric area.  Skin:    General: Skin is warm.     Capillary Refill: Capillary refill takes less than 2 seconds.  Neurological:     General: No focal deficit present.     Mental Status: She is alert and oriented to person, place, and  time.  Psychiatric:        Mood and Affect: Mood normal.        Behavior: Behavior normal.     ED Results / Procedures / Treatments   Labs (all labs ordered are listed, but only abnormal results are displayed) Labs Reviewed  COMPREHENSIVE METABOLIC PANEL - Abnormal; Notable for the following components:      Result Value   Glucose, Bld 176 (*)    Creatinine, Ser 1.32 (*)    Alkaline Phosphatase 164 (*)    GFR, Estimated 42 (*)    All other components within normal limits  URINALYSIS, ROUTINE W REFLEX MICROSCOPIC - Abnormal; Notable for the following components:   Color, Urine STRAW (*)    Leukocytes,Ua TRACE (*)    All other components within normal limits  GASTROINTESTINAL PANEL BY PCR, STOOL (REPLACES STOOL CULTURE)  C DIFFICILE QUICK SCREEN W PCR REFLEX    LIPASE, BLOOD  CBC    EKG None  Radiology CT ABDOMEN PELVIS W CONTRAST  Result Date: 02/06/2022 CLINICAL DATA:  Acute nonlocalized abdominal pain; history lung cancer, diabetes mellitus, emphysema, GERD, former smoker EXAM: CT ABDOMEN AND PELVIS WITH CONTRAST TECHNIQUE: Multidetector CT imaging of the abdomen and pelvis was performed using the standard protocol following bolus administration of intravenous contrast. RADIATION DOSE REDUCTION: This exam was performed according to the departmental dose-optimization program which includes automated exposure control, adjustment of the mA and/or kV according to patient size and/or use of iterative reconstruction technique. CONTRAST:  27mL OMNIPAQUE IOHEXOL 300 MG/ML SOLN IV. No oral contrast. COMPARISON:  PET-CT 10/12/2007 FINDINGS: Lower chest: Lung bases emphysematous but clear. Calcified granuloma LEFT lower lobe. Hepatobiliary: Gallbladder and liver normal appearance Pancreas: Normal appearance Spleen: Normal appearance Adrenals/Urinary Tract: LEFT adrenal mass 1.7 x 1.2 cm previously 1.6 x 1.1 cm, did not demonstrate abnormal metabolic activity on prior PET-CT, on current CT  demonstrates significant washout of tracer from 104 HU to 55 HU on delayed images favor adenoma. RIGHT adrenal gland unremarkable. Kidneys, ureters, and bladder normal appearance Stomach/Bowel: Normal appendix. Small hiatal hernia. Stomach and bowel loops otherwise normal appearance. Vascular/Lymphatic: Atherosclerotic calcifications aorta without aneurysm. No adenopathy. Reproductive: Calcified leiomyoma within uterus. Uterus and ovaries otherwise normal appearance Other: No free air or free fluid. Small umbilical hernia containing fat. No acute inflammatory process. Musculoskeletal: Osseous structures unremarkable. IMPRESSION: No acute intra-abdominal or intrapelvic abnormalities. Small hiatal hernia. Small umbilical hernia containing fat. LEFT adrenal mass 1.7 x 1.2 cm, little changed since 2009, favor adenoma as  above. Aortic Atherosclerosis (ICD10-I70.0) and Emphysema (ICD10-J43.9). Electronically Signed   By: Lavonia Dana M.D.   On: 02/06/2022 10:08    Procedures Procedures    Medications Ordered in ED Medications  sodium chloride 0.9 % bolus 1,000 mL (1,000 mLs Intravenous New Bag/Given 02/06/22 0910)  morphine (PF) 4 MG/ML injection 4 mg (4 mg Intravenous Given 02/06/22 0910)  ondansetron (ZOFRAN) injection 4 mg (4 mg Intravenous Given 02/06/22 0908)  iohexol (OMNIPAQUE) 300 MG/ML solution 100 mL (80 mLs Intravenous Contrast Given 02/06/22 0957)    ED Course/ Medical Decision Making/ A&P                           Medical Decision Making Amount and/or Complexity of Data Reviewed Labs: ordered. Radiology: ordered.  Risk Prescription drug management.   This patient presents to the ED for concern of abd pain, this involves an extensive number of treatment options, and is a complaint that carries with it a high risk of complications and morbidity.  The differential diagnosis includes gastritis, gastroenteritis, GERD, infection, cholecystitis, appendicitis, uti   Co morbidities that complicate  the patient evaluation   hiatal hernia, GERD, PUD, DM, COPD, lung cancer, and anxiety   Additional history obtained:  Additional history obtained from epic chart review External records from outside source obtained and reviewed including husband   Lab Tests:  I Ordered, and personally interpreted labs.  The pertinent results include:  cbc nl, ua nl, cmp with glucose elevated at 176 and Cr elevated at 1.32 (chronic) and AP elevated at 164 (chronic); lip nl   Imaging Studies ordered:  I ordered imaging studies including ct abd/pelvis  I independently visualized and interpreted imaging which showed  Musculoskeletal: Osseous structures unremarkable.    IMPRESSION:  No acute intra-abdominal or intrapelvic abnormalities.    Small hiatal hernia.    Small umbilical hernia containing fat.    LEFT adrenal mass 1.7 x 1.2 cm, little changed since 2009, favor  adenoma as above.    Aortic Atherosclerosis (ICD10-I70.0) and Emphysema (ICD10-J43.9).   I agree with the radiologist interpretation   Cardiac Monitoring:  The patient was maintained on a cardiac monitor.  I personally viewed and interpreted the cardiac monitored which showed an underlying rhythm of: nsr   Medicines ordered and prescription drug management:  I ordered medication including morphine and zofran  for pain and nausea; ivfs for dehydration  Reevaluation of the patient after these medicines showed that the patient improved I have reviewed the patients home medicines and have made adjustments as needed   Test Considered:  ct   Critical Interventions:  Pain control   Problem List / ED Course:  Abd pain:  with the n/v/d, pt likely had an episode of viral gastroenteritis.  Labs do not show anything acute.  CT shows nothing acute.  Pt is able to drink fluids.  Nausea and diarrhea is better.  She has not had any stool here to send for a stool study.  Pt really wants surgery to fix her problem.  There is  nothing surgical to fix her problem.  We talked a lot about food choices and exercise and ways to manage her GERD which seems to be her biggest issue.  She is to return if worse.  F/u with gi and with pcp.   Reevaluation:  After the interventions noted above, I reevaluated the patient and found that they have :improved   Social Determinants of Health:  Lives at home with husband   Dispostion:  After consideration of the diagnostic results and the patients response to treatment, I feel that the patent would benefit from discharge with outpatient f/u.          Final Clinical Impression(s) / ED Diagnoses Final diagnoses:  Epigastric pain  Dehydration    Rx / DC Orders ED Discharge Orders          Ordered    ondansetron (ZOFRAN-ODT) 4 MG disintegrating tablet  Every 8 hours PRN        02/06/22 1113    dicyclomine (BENTYL) 20 MG tablet  2 times daily        02/06/22 1113              Isla Pence, MD 02/06/22 1113

## 2022-02-06 NOTE — ED Triage Notes (Signed)
Pt states she was diagnosed with a hiatal hernia and acid reflux last year. Pt still continues to have issues with it. Pt was placed on nexium last month. Pt states she has ongoing pain in her stomach, but last night began vomiting and having diarrhea. Denies cp, sob, dizziness.

## 2022-02-11 DIAGNOSIS — K219 Gastro-esophageal reflux disease without esophagitis: Secondary | ICD-10-CM | POA: Diagnosis not present

## 2022-02-11 DIAGNOSIS — R197 Diarrhea, unspecified: Secondary | ICD-10-CM | POA: Diagnosis not present

## 2022-02-11 DIAGNOSIS — R1110 Vomiting, unspecified: Secondary | ICD-10-CM | POA: Diagnosis not present

## 2022-02-11 DIAGNOSIS — R109 Unspecified abdominal pain: Secondary | ICD-10-CM | POA: Diagnosis not present

## 2022-02-11 DIAGNOSIS — K5900 Constipation, unspecified: Secondary | ICD-10-CM | POA: Diagnosis not present

## 2022-02-12 DIAGNOSIS — R197 Diarrhea, unspecified: Secondary | ICD-10-CM | POA: Diagnosis not present

## 2022-02-12 DIAGNOSIS — R1110 Vomiting, unspecified: Secondary | ICD-10-CM | POA: Diagnosis not present

## 2022-02-12 DIAGNOSIS — R109 Unspecified abdominal pain: Secondary | ICD-10-CM | POA: Diagnosis not present

## 2022-04-01 DIAGNOSIS — Z1231 Encounter for screening mammogram for malignant neoplasm of breast: Secondary | ICD-10-CM | POA: Diagnosis not present

## 2022-04-22 DIAGNOSIS — G620 Drug-induced polyneuropathy: Secondary | ICD-10-CM | POA: Diagnosis not present

## 2022-04-22 DIAGNOSIS — E7800 Pure hypercholesterolemia, unspecified: Secondary | ICD-10-CM | POA: Diagnosis not present

## 2022-04-22 DIAGNOSIS — K219 Gastro-esophageal reflux disease without esophagitis: Secondary | ICD-10-CM | POA: Diagnosis not present

## 2022-04-22 DIAGNOSIS — Z85118 Personal history of other malignant neoplasm of bronchus and lung: Secondary | ICD-10-CM | POA: Diagnosis not present

## 2022-04-22 DIAGNOSIS — I10 Essential (primary) hypertension: Secondary | ICD-10-CM | POA: Diagnosis not present

## 2022-04-22 DIAGNOSIS — E1122 Type 2 diabetes mellitus with diabetic chronic kidney disease: Secondary | ICD-10-CM | POA: Diagnosis not present

## 2022-04-22 DIAGNOSIS — N183 Chronic kidney disease, stage 3 unspecified: Secondary | ICD-10-CM | POA: Diagnosis not present

## 2022-04-22 DIAGNOSIS — Z0000 Encounter for general adult medical examination without abnormal findings: Secondary | ICD-10-CM | POA: Diagnosis not present

## 2022-04-22 DIAGNOSIS — L42 Pityriasis rosea: Secondary | ICD-10-CM | POA: Diagnosis not present

## 2022-04-22 DIAGNOSIS — J449 Chronic obstructive pulmonary disease, unspecified: Secondary | ICD-10-CM | POA: Diagnosis not present

## 2022-04-22 DIAGNOSIS — I700 Atherosclerosis of aorta: Secondary | ICD-10-CM | POA: Diagnosis not present

## 2022-06-23 DIAGNOSIS — E782 Mixed hyperlipidemia: Secondary | ICD-10-CM | POA: Diagnosis not present

## 2022-10-20 ENCOUNTER — Ambulatory Visit
Admission: RE | Admit: 2022-10-20 | Discharge: 2022-10-20 | Disposition: A | Discharge status: Home / Self Care | Payer: Medicare Other | Source: Ambulatory Visit | Attending: Internal Medicine | Admitting: Internal Medicine

## 2022-10-20 ENCOUNTER — Other Ambulatory Visit: Payer: Self-pay | Admitting: Internal Medicine

## 2022-10-20 DIAGNOSIS — E1122 Type 2 diabetes mellitus with diabetic chronic kidney disease: Secondary | ICD-10-CM | POA: Diagnosis not present

## 2022-10-20 DIAGNOSIS — G620 Drug-induced polyneuropathy: Secondary | ICD-10-CM | POA: Diagnosis not present

## 2022-10-20 DIAGNOSIS — M549 Dorsalgia, unspecified: Secondary | ICD-10-CM

## 2022-10-20 DIAGNOSIS — I700 Atherosclerosis of aorta: Secondary | ICD-10-CM | POA: Diagnosis not present

## 2022-10-20 DIAGNOSIS — M79604 Pain in right leg: Secondary | ICD-10-CM | POA: Diagnosis not present

## 2022-10-20 DIAGNOSIS — M545 Low back pain, unspecified: Secondary | ICD-10-CM | POA: Diagnosis not present

## 2022-10-20 DIAGNOSIS — R6889 Other general symptoms and signs: Secondary | ICD-10-CM | POA: Diagnosis not present

## 2022-10-20 DIAGNOSIS — J449 Chronic obstructive pulmonary disease, unspecified: Secondary | ICD-10-CM | POA: Diagnosis not present

## 2022-10-20 DIAGNOSIS — N1831 Chronic kidney disease, stage 3a: Secondary | ICD-10-CM | POA: Diagnosis not present

## 2022-11-20 DIAGNOSIS — M5442 Lumbago with sciatica, left side: Secondary | ICD-10-CM | POA: Diagnosis not present

## 2023-04-07 DIAGNOSIS — Z1231 Encounter for screening mammogram for malignant neoplasm of breast: Secondary | ICD-10-CM | POA: Diagnosis not present

## 2023-04-27 ENCOUNTER — Other Ambulatory Visit: Payer: Self-pay | Admitting: Internal Medicine

## 2023-04-27 ENCOUNTER — Ambulatory Visit: Admission: RE | Admit: 2023-04-27 | Payer: Medicare Other | Source: Ambulatory Visit

## 2023-04-27 DIAGNOSIS — J449 Chronic obstructive pulmonary disease, unspecified: Secondary | ICD-10-CM | POA: Diagnosis not present

## 2023-04-27 DIAGNOSIS — K219 Gastro-esophageal reflux disease without esophagitis: Secondary | ICD-10-CM | POA: Diagnosis not present

## 2023-04-27 DIAGNOSIS — R413 Other amnesia: Secondary | ICD-10-CM | POA: Diagnosis not present

## 2023-04-27 DIAGNOSIS — Z85118 Personal history of other malignant neoplasm of bronchus and lung: Secondary | ICD-10-CM

## 2023-04-27 DIAGNOSIS — Z0000 Encounter for general adult medical examination without abnormal findings: Secondary | ICD-10-CM | POA: Diagnosis not present

## 2023-04-27 DIAGNOSIS — E1122 Type 2 diabetes mellitus with diabetic chronic kidney disease: Secondary | ICD-10-CM | POA: Diagnosis not present

## 2023-04-27 DIAGNOSIS — I700 Atherosclerosis of aorta: Secondary | ICD-10-CM | POA: Diagnosis not present

## 2023-04-27 DIAGNOSIS — I10 Essential (primary) hypertension: Secondary | ICD-10-CM | POA: Diagnosis not present

## 2023-04-27 DIAGNOSIS — G620 Drug-induced polyneuropathy: Secondary | ICD-10-CM | POA: Diagnosis not present

## 2023-04-27 DIAGNOSIS — M1990 Unspecified osteoarthritis, unspecified site: Secondary | ICD-10-CM | POA: Diagnosis not present

## 2023-04-27 DIAGNOSIS — K279 Peptic ulcer, site unspecified, unspecified as acute or chronic, without hemorrhage or perforation: Secondary | ICD-10-CM | POA: Diagnosis not present

## 2023-04-27 DIAGNOSIS — E7800 Pure hypercholesterolemia, unspecified: Secondary | ICD-10-CM | POA: Diagnosis not present

## 2023-04-27 DIAGNOSIS — N1831 Chronic kidney disease, stage 3a: Secondary | ICD-10-CM | POA: Diagnosis not present

## 2023-04-28 ENCOUNTER — Encounter: Payer: Self-pay | Admitting: Neurology

## 2023-04-28 ENCOUNTER — Encounter: Payer: Self-pay | Admitting: Physician Assistant

## 2023-04-30 DIAGNOSIS — E538 Deficiency of other specified B group vitamins: Secondary | ICD-10-CM | POA: Diagnosis not present

## 2023-05-07 DIAGNOSIS — E538 Deficiency of other specified B group vitamins: Secondary | ICD-10-CM | POA: Diagnosis not present

## 2023-05-14 DIAGNOSIS — E538 Deficiency of other specified B group vitamins: Secondary | ICD-10-CM | POA: Diagnosis not present

## 2023-05-21 DIAGNOSIS — E538 Deficiency of other specified B group vitamins: Secondary | ICD-10-CM | POA: Diagnosis not present

## 2023-05-26 ENCOUNTER — Ambulatory Visit: Payer: Medicare Other

## 2023-05-26 ENCOUNTER — Ambulatory Visit: Payer: Medicare Other | Admitting: Physician Assistant

## 2023-06-02 DIAGNOSIS — R202 Paresthesia of skin: Secondary | ICD-10-CM | POA: Diagnosis not present

## 2023-06-12 ENCOUNTER — Ambulatory Visit (HOSPITAL_COMMUNITY)
Admission: EM | Admit: 2023-06-12 | Discharge: 2023-06-12 | Disposition: A | Discharge status: Home / Self Care | Payer: Medicare Other | Attending: Emergency Medicine | Admitting: Emergency Medicine

## 2023-06-12 ENCOUNTER — Encounter (HOSPITAL_COMMUNITY): Payer: Self-pay | Admitting: *Deleted

## 2023-06-12 DIAGNOSIS — H8113 Benign paroxysmal vertigo, bilateral: Secondary | ICD-10-CM | POA: Diagnosis not present

## 2023-06-12 DIAGNOSIS — R112 Nausea with vomiting, unspecified: Secondary | ICD-10-CM | POA: Insufficient documentation

## 2023-06-12 DIAGNOSIS — I10 Essential (primary) hypertension: Secondary | ICD-10-CM | POA: Diagnosis not present

## 2023-06-12 DIAGNOSIS — E119 Type 2 diabetes mellitus without complications: Secondary | ICD-10-CM | POA: Insufficient documentation

## 2023-06-12 DIAGNOSIS — Z1152 Encounter for screening for COVID-19: Secondary | ICD-10-CM | POA: Insufficient documentation

## 2023-06-12 DIAGNOSIS — M7989 Other specified soft tissue disorders: Secondary | ICD-10-CM | POA: Insufficient documentation

## 2023-06-12 MED ORDER — MECLIZINE HCL 12.5 MG PO TABS: 12.5000 mg | ORAL_TABLET | Freq: Three times a day (TID) | ORAL | 0 refills | Status: AC | PRN

## 2023-06-12 MED ORDER — ONDANSETRON 4 MG PO TBDP: 4.0000 mg | ORAL_TABLET | Freq: Three times a day (TID) | ORAL | 0 refills | Status: AC | PRN

## 2023-06-12 NOTE — Discharge Instructions (Signed)
You can take meclizine 3 times daily as needed for dizziness. This can make you drowsy so do not drive while taking this. You can take Zofran every 8 hours for nausea and vomiting. PLEASE take your blood pressure medication daily. I have ordered some blood work which will come back today and I will call you if there is anything concerning. Your COVID results were returned within 24 hours and someone will call if they are positive otherwise your results will be on MyChart.  If your symptoms persist she can return here for reevaluation.  If you develop severe head pain, blurry vision, numbness, tingling, weakness, confusion, chest pain, severe shortness of breath or any other concerning symptoms please seek immediate medical treatment in the ER.

## 2023-06-12 NOTE — ED Provider Notes (Addendum)
[facility]    CSN: [id number] Arrival date & time: 06/12/23  1304      History   Chief Complaint Chief Complaint  Patient presents with   Dizziness   Emesis    HPI Kelly Herman is a 77 y.o. female.   Patient presents with intermittent dizziness and nausea that began 4 days ago. Patient also reports some mild bilateral leg swelling that she noticed yesterday. Denies chest pain, shortness of breath, headache, blurred vision, numbness, tingling, and weakness. History of hypertension and diabetes.   Dizziness Associated symptoms: nausea   Associated symptoms: no chest pain, no diarrhea, no headaches, no shortness of breath, no vomiting and no weakness   Emesis Associated symptoms: no abdominal pain, no chills, no diarrhea, no fever and no headaches     Past Medical History:  Diagnosis Date   Allergy    Cancer (HCC)    Diabetes mellitus without complication (HCC)    Emphysema of lung (HCC)    GERD (gastroesophageal reflux disease)    Ulcer     Patient Active Problem List   Diagnosis Date Noted   Lung cancer, upper lobe (HCC) 11/18/2011    Past Surgical History:  Procedure Laterality Date   LUNG SURGERY      OB History   No obstetric history on file.      Home Medications    Prior to Admission medications   Medication Sig Start Date End Date Taking? Authorizing Provider  atorvastatin (LIPITOR) 20 MG tablet Take 20 mg by mouth daily. 12/08/21  Yes [provider]  esomeprazole (NEXIUM) 40 MG capsule Take 40 mg by mouth daily before breakfast.   Yes [provider]  FLUoxetine (PROZAC) 20 MG capsule Take 20 mg by mouth daily. 01/25/22  Yes [provider]  glipiZIDE (GLUCOTROL) 5 MG tablet Take 5 mg by mouth 2 (two) times daily.   Yes [provider]  losartan (COZAAR) 50 MG tablet Take 50 mg by mouth daily. 11/25/21  Yes [provider]  meclizine (ANTIVERT) 12.5 MG tablet Take 1 tablet (12.5 mg total)  by mouth 3 (three) times daily as needed for dizziness. 06/12/23  Yes Susann Givens, Joshua Soulier A, NP  ondansetron (ZOFRAN-ODT) 4 MG disintegrating tablet Take 1 tablet (4 mg total) by mouth every 8 (eight) hours as needed for nausea or vomiting. 06/12/23  Yes Kieron Kantner A, NP  pioglitazone (ACTOS) 30 MG tablet Take 30 mg by mouth daily.   Yes [provider]  BREZTRI AEROSPHERE 160-9-4.8 MCG/ACT AERO Inhale 2 puffs into the lungs 2 (two) times daily. 05/04/23   [provider]  LORazepam (ATIVAN) 0.5 MG tablet Take 0.5 mg by mouth daily as needed for anxiety. 01/24/22   [provider]    Family History History reviewed. No pertinent family history.  Social History Social History   Tobacco Use   Smoking status: Former    Current packs/day: 0.00    Types: Cigarettes    Quit date: 09/01/2006    Years since quitting: 16.7  Vaping Use   Vaping status: Never Used  Substance Use Topics   Alcohol use: No   Drug use: No     Allergies   Angiotensin receptor blockers, Metformin and related, Nsaids, and Tramadol   Review of Systems Review of Systems  Constitutional:  Negative for chills, fatigue and fever.  HENT:  Positive for congestion and rhinorrhea.   Respiratory:  Negative for chest tightness and shortness of breath.  Cardiovascular:  Positive for leg swelling. Negative for chest pain.  Gastrointestinal:  Positive for nausea. Negative for abdominal pain, diarrhea and vomiting.  Neurological:  Positive for dizziness. Negative for syncope, facial asymmetry, speech difficulty, weakness, light-headedness, numbness and headaches.  Psychiatric/Behavioral:  Negative for confusion.      Physical Exam Triage Vital Signs ED Triage Vitals  Encounter Vitals Group     BP 06/12/23 1331 (!) 213/80     Systolic BP Percentile --      Diastolic BP Percentile --      Pulse Rate 06/12/23 1331 76     Resp 06/12/23 1331 18     Temp 06/12/23 1331 98.4 F (36.9 C)      Temp Source 06/12/23 1331 Oral     SpO2 06/12/23 1331 94 %     Weight --      Height --      Head Circumference --      Peak Flow --      Pain Score 06/12/23 1325 0     Pain Loc --      Pain Education --      Exclude from Growth Chart --    No data found.  Updated Vital Signs BP (!) 166/84 (BP Location: Left Arm)   Pulse 76   Temp 98.4 F (36.9 C) (Oral)   Resp 18   SpO2 94%   Visual Acuity Right Eye Distance:   Left Eye Distance:   Bilateral Distance:    Right Eye Near:   Left Eye Near:    Bilateral Near:     Physical Exam Vitals and nursing note reviewed.  Constitutional:      General: She is awake. She is not in acute distress.    Appearance: Normal appearance. She is well-developed and well-groomed. She is not ill-appearing, toxic-appearing or diaphoretic.  HENT:     Head: Normocephalic.     Right Ear: There is impacted cerumen.     Left Ear: There is impacted cerumen.     Nose: Congestion and rhinorrhea present.     Right Sinus: No maxillary sinus tenderness or frontal sinus tenderness.     Left Sinus: No maxillary sinus tenderness or frontal sinus tenderness.     Mouth/Throat:     Mouth: Mucous membranes are moist.     Pharynx: Oropharynx is clear.  Eyes:     Extraocular Movements: Extraocular movements intact.     Pupils: Pupils are equal, round, and reactive to light.  Cardiovascular:     Rate and Rhythm: Normal rate.     Heart sounds: Normal heart sounds.  Pulmonary:     Effort: Pulmonary effort is normal.  Musculoskeletal:        General: Normal range of motion.     Cervical back: Normal range of motion and neck supple.     Right lower leg: 1+ Edema present.     Left lower leg: 1+ Edema present.  Skin:    General: Skin is warm and dry.  Neurological:     General: No focal deficit present.     Mental Status: She is alert and oriented to person, place, and time.     GCS: GCS eye subscore is 4. GCS verbal subscore is 5. GCS motor subscore is 6.      Cranial Nerves: Cranial nerves 2-12 are intact.     Sensory: Sensation is intact.     Motor: Motor function is intact.     Coordination: Romberg sign  positive.     Gait: Gait is intact.  Psychiatric:        Attention and Perception: Attention and perception normal.        Mood and Affect: Mood and affect normal.        Speech: Speech normal.        Behavior: Behavior normal. Behavior is cooperative.        Thought Content: Thought content normal.        Cognition and Memory: Cognition and memory normal.        Judgment: Judgment normal.      UC Treatments / Results  Labs (all labs ordered are listed, but only abnormal results are displayed) Labs Reviewed  SARS CORONAVIRUS 2 (TAT 6-24 HRS)  CBC  COMPREHENSIVE METABOLIC PANEL    EKG   Radiology No results found.  Procedures Procedures (including critical care time)  Medications Ordered in UC Medications - No data to display  Initial Impression / Assessment and Plan / UC Course  I have reviewed the triage vital signs and the nursing notes.  Pertinent labs & imaging results that were available during my care of the patient were reviewed by me and considered in my medical decision making (see chart for details).     Patient presented with 4-day history of intermittent dizziness and nausea.  Patient also reports noticing some mild bilateral leg swelling yesterday.  Denies chest pain, shortness of breath, headache, blurred vision, numbness, tingling, and weakness.  History of hypertension and diabetes.  Patient states she did not take her blood pressure medication today due to feeling nauseous.  Upon assessment patient has bilateral impacted cerumen, and congestion and rhinorrhea present.  Upon reassessment after ear irrigation, no obvious signs of infection.  Romberg sign positive, no other neurodeficits noted. EOMI and PERRLA. GCS 15. +1 nonpitting bilateral leg swelling noted.  No other significant findings upon exam.   Blood pressure was initially 213/180.  After recheck blood pressure was 166/84.  EKG revealed normal sinus rhythm.  Ordered COVID testing per request of husband.  Ordered CBC and CMP to evaluate for electrolyte imbalance.  Unable to obtain labs today.  Recommended coming back tomorrow after hydrating to collect blood work.  Patient agreeable to this plan at this time.  Recommended patient take blood pressure medication.  Prescribed meclizine as needed for dizziness and Zofran as needed for nausea.  Discussed follow-up, return, strict ER precautions. Final Clinical Impressions(s) / UC Diagnoses   Final diagnoses:  Benign paroxysmal positional vertigo due to bilateral vestibular disorder  Nausea and vomiting, unspecified vomiting type  Essential hypertension     Discharge Instructions      You can take meclizine 3 times daily as needed for dizziness. This can make you drowsy so do not drive while taking this. You can take Zofran every 8 hours for nausea and vomiting. PLEASE take your blood pressure medication daily. I have ordered some blood work which will come back today and I will call you if there is anything concerning. Your COVID results were returned within 24 hours and someone will call if they are positive otherwise your results will be on MyChart.  If your symptoms persist she can return here for reevaluation.  If you develop severe head pain, blurry vision, numbness, tingling, weakness, confusion, chest pain, severe shortness of breath or any other concerning symptoms please seek immediate medical treatment in the ER.    ED Prescriptions     Medication Sig Dispense Auth. Provider  meclizine (ANTIVERT) 12.5 MG tablet Take 1 tablet (12.5 mg total) by mouth 3 (three) times daily as needed for dizziness. 30 tablet Susann Givens, Brenden Rudman A, NP   ondansetron (ZOFRAN-ODT) 4 MG disintegrating tablet Take 1 tablet (4 mg total) by mouth every 8 (eight) hours as needed for nausea or vomiting. 10  tablet Wynonia Lawman A, NP      PDMP not reviewed this encounter.   Letta Kocher, NP 06/12/23 1523    Letta Kocher, NP 06/12/23 1524    Wynonia Lawman A, NP 06/12/23 1539

## 2023-06-12 NOTE — ED Triage Notes (Signed)
Pt states that yesterday she started having some nausea. Today she has some dizziness not like she is going to fall but she feels it in her face and ears. She took zyrtec a couple of days ago.    She states that her upper legs and butt feel tender when she touches them or sit. She went to dermatology yesterday and started using the cream yesterday but wants to know if its relate. She would like them looked at today.

## 2023-06-13 ENCOUNTER — Encounter (HOSPITAL_COMMUNITY): Payer: Self-pay | Admitting: Emergency Medicine

## 2023-06-13 ENCOUNTER — Ambulatory Visit (HOSPITAL_COMMUNITY)
Admission: EM | Admit: 2023-06-13 | Discharge: 2023-06-13 | Disposition: A | Discharge status: Home / Self Care | Payer: Medicare Other | Attending: Emergency Medicine | Admitting: Emergency Medicine

## 2023-06-13 DIAGNOSIS — I10 Essential (primary) hypertension: Secondary | ICD-10-CM | POA: Insufficient documentation

## 2023-06-13 DIAGNOSIS — Z87891 Personal history of nicotine dependence: Secondary | ICD-10-CM | POA: Diagnosis not present

## 2023-06-13 DIAGNOSIS — R112 Nausea with vomiting, unspecified: Secondary | ICD-10-CM | POA: Insufficient documentation

## 2023-06-13 DIAGNOSIS — H8113 Benign paroxysmal vertigo, bilateral: Secondary | ICD-10-CM | POA: Diagnosis not present

## 2023-06-13 DIAGNOSIS — R600 Localized edema: Secondary | ICD-10-CM | POA: Diagnosis not present

## 2023-06-13 DIAGNOSIS — R42 Dizziness and giddiness: Secondary | ICD-10-CM

## 2023-06-13 LAB — CBC
HCT: 40.6 % (ref 36.0–46.0)
Hemoglobin: 13 g/dL (ref 12.0–15.0)
MCH: 29.1 pg (ref 26.0–34.0)
MCHC: 32 g/dL (ref 30.0–36.0)
MCV: 91 fL (ref 80.0–100.0)
Platelets: 254 10*3/uL (ref 150–400)
RBC: 4.46 MIL/uL (ref 3.87–5.11)
RDW: 13.5 % (ref 11.5–15.5)
WBC: 6.9 10*3/uL (ref 4.0–10.5)
nRBC: 0 % (ref 0.0–0.2)

## 2023-06-13 LAB — SARS CORONAVIRUS 2 (TAT 6-24 HRS): SARS Coronavirus 2: NEGATIVE

## 2023-06-13 NOTE — ED Triage Notes (Signed)
Pt here for blood draw only

## 2023-06-15 DIAGNOSIS — M160 Bilateral primary osteoarthritis of hip: Secondary | ICD-10-CM | POA: Diagnosis not present

## 2023-06-15 DIAGNOSIS — M5441 Lumbago with sciatica, right side: Secondary | ICD-10-CM | POA: Diagnosis not present

## 2023-06-19 DIAGNOSIS — M25551 Pain in right hip: Secondary | ICD-10-CM | POA: Diagnosis not present

## 2023-06-23 ENCOUNTER — Ambulatory Visit (HOSPITAL_COMMUNITY)
Admission: EM | Admit: 2023-06-23 | Discharge: 2023-06-23 | Disposition: A | Discharge status: Home / Self Care | Payer: Medicare Other | Attending: Internal Medicine | Admitting: Internal Medicine

## 2023-06-23 ENCOUNTER — Encounter (HOSPITAL_COMMUNITY): Payer: Self-pay | Admitting: Emergency Medicine

## 2023-06-23 DIAGNOSIS — R490 Dysphonia: Secondary | ICD-10-CM

## 2023-06-23 NOTE — ED Triage Notes (Addendum)
Pt seen here 2 weeks ago for cold. Reports told that Covid was negative. Still has cough and congestion. Wants to make sure she hasn't have cancer. Using cough drops.

## 2023-06-23 NOTE — Discharge Instructions (Signed)
Follow-up with the ear nose and throat doctor

## 2023-06-23 NOTE — ED Provider Notes (Signed)
[facility]    CSN: [id number] Arrival date & time: 06/23/23  1207      History   Chief Complaint Chief Complaint  Patient presents with   Cough   Nasal Congestion    HPI Kelly Herman is a 77 y.o. female.    Cough Has had a hoarse voice for greater than 1 year, she is concerned that she may have cancer.  Had lung cancer in the past, has a diagnosis of COPD which causes chronic cough.  Had recent illness 2 weeks ago seen here 06/12/2023 for cough and congestion tested negative for COVID. Denies chest pain, shortness of breath, change in weight, sore throat, swollen glands, ear pain  Past Medical History:  Diagnosis Date   Allergy    Cancer (HCC)    Diabetes mellitus without complication (HCC)    Emphysema of lung (HCC)    GERD (gastroesophageal reflux disease)    Ulcer     Patient Active Problem List   Diagnosis Date Noted   Lung cancer, upper lobe (HCC) 11/18/2011    Past Surgical History:  Procedure Laterality Date   LUNG SURGERY      OB History   No obstetric history on file.      Home Medications    Prior to Admission medications   Medication Sig Start Date End Date Taking? Authorizing Provider  celecoxib (CELEBREX) 200 MG capsule Take 200 mg by mouth 2 (two) times daily. 06/15/23  Yes [provider]  atorvastatin (LIPITOR) 20 MG tablet Take 20 mg by mouth daily. 12/08/21   [provider]  BREZTRI AEROSPHERE 160-9-4.8 MCG/ACT AERO Inhale 2 puffs into the lungs 2 (two) times daily. 05/04/23   [provider]  esomeprazole (NEXIUM) 40 MG capsule Take 40 mg by mouth daily before breakfast.    [provider]  FLUoxetine (PROZAC) 20 MG capsule Take 20 mg by mouth daily. 01/25/22   [provider]  glipiZIDE (GLUCOTROL) 5 MG tablet Take 5 mg by mouth 2 (two) times daily.    [provider]  LORazepam (ATIVAN) 0.5 MG tablet Take 0.5 mg by mouth daily as needed for anxiety. 01/24/22   [provider]  losartan (COZAAR) 50 MG tablet Take 50 mg by mouth daily. 11/25/21   [provider]  meclizine (ANTIVERT) 12.5 MG tablet Take 1 tablet (12.5 mg total) by mouth 3 (three) times daily as needed for dizziness. 06/12/23   Wynonia Lawman A, NP  ondansetron (ZOFRAN-ODT) 4 MG disintegrating tablet Take 1 tablet (4 mg total) by mouth every 8 (eight) hours as needed for nausea or vomiting. 06/12/23   Wynonia Lawman A, NP  pioglitazone (ACTOS) 30 MG tablet Take 30 mg by mouth daily.    [provider]    Family History No family history on file.  Social History Social History   Tobacco Use   Smoking status: Former    Current packs/day: 0.00    Types: Cigarettes    Quit date: 09/01/2006    Years since quitting: 16.8  Vaping Use   Vaping status: Never Used  Substance Use Topics   Alcohol use: No   Drug use: No     Allergies   Angiotensin receptor blockers, Metformin and related, Nsaids, and Tramadol   Review of Systems Review of Systems  Respiratory:  Positive for cough.      Physical Exam Triage Vital Signs ED Triage Vitals  Encounter Vitals Group     BP 06/23/23  1219 103/67     Systolic BP Percentile --      Diastolic BP Percentile --      Pulse Rate 06/23/23 1219 69     Resp 06/23/23 1219 18     Temp 06/23/23 1219 98 F (36.7 C)     Temp Source 06/23/23 1219 Oral     SpO2 06/23/23 1219 96 %     Weight --      Height --      Head Circumference --      Peak Flow --      Pain Score 06/23/23 1217 0     Pain Loc --      Pain Education --      Exclude from Growth Chart --    No data found.  Updated Vital Signs BP 103/67 (BP Location: Right Arm)   Pulse 69   Temp 98 F (36.7 C) (Oral)   Resp 18   SpO2 96%   Visual Acuity Right Eye Distance:   Left Eye Distance:   Bilateral Distance:    Right Eye Near:   Left Eye Near:    Bilateral Near:     Physical Exam Vitals reviewed.  Constitutional:      Appearance: She is not  ill-appearing.  HENT:     Head: Normocephalic and atraumatic.     Right Ear: Tympanic membrane and ear canal normal.     Left Ear: Ear canal normal.     Nose: No rhinorrhea.     Mouth/Throat:     Mouth: Mucous membranes are moist.     Pharynx: Oropharynx is clear. No oropharyngeal exudate or posterior oropharyngeal erythema.     Comments: Hoarse voice Eyes:     Conjunctiva/sclera: Conjunctivae normal.  Cardiovascular:     Rate and Rhythm: Normal rate.     Heart sounds: Normal heart sounds.  Pulmonary:     Effort: Pulmonary effort is normal. No respiratory distress.  Musculoskeletal:     Cervical back: Neck supple.  Lymphadenopathy:     Cervical: No cervical adenopathy.  Skin:    General: Skin is warm and dry.  Neurological:     Mental Status: She is alert and oriented to person, place, and time.      UC Treatments / Results  Labs (all labs ordered are listed, but only abnormal results are displayed) Labs Reviewed - No data to display  EKG   Radiology No results found.  Procedures Procedures (including critical care time)  Medications Ordered in UC Medications - No data to display  Initial Impression / Assessment and Plan / UC Course  I have reviewed the triage vital signs and the nursing notes.  Pertinent labs & imaging results that were available during my care of the patient were reviewed by me and considered in my medical decision making (see chart for details).     77 year old former smoker presents with concerns due to hoarse voice for greater than 1 year.  Has hoarse voice otherwise exam within normal limits.  Recommend follow-up with ENT.  Final Clinical Impressions(s) / UC Diagnoses   Final diagnoses:  None   Discharge Instructions   None    ED Prescriptions   None    PDMP not reviewed this encounter.   Meliton Rattan, [place] 06/23/23 1236

## 2023-07-01 DIAGNOSIS — J449 Chronic obstructive pulmonary disease, unspecified: Secondary | ICD-10-CM | POA: Diagnosis not present

## 2023-07-01 DIAGNOSIS — R0981 Nasal congestion: Secondary | ICD-10-CM | POA: Diagnosis not present

## 2023-07-01 DIAGNOSIS — Z23 Encounter for immunization: Secondary | ICD-10-CM | POA: Diagnosis not present

## 2023-07-01 DIAGNOSIS — J309 Allergic rhinitis, unspecified: Secondary | ICD-10-CM | POA: Diagnosis not present

## 2023-07-10 DIAGNOSIS — M6281 Muscle weakness (generalized): Secondary | ICD-10-CM | POA: Diagnosis not present

## 2023-07-10 DIAGNOSIS — M48061 Spinal stenosis, lumbar region without neurogenic claudication: Secondary | ICD-10-CM | POA: Diagnosis not present

## 2023-07-21 ENCOUNTER — Encounter (INDEPENDENT_AMBULATORY_CARE_PROVIDER_SITE_OTHER): Payer: Self-pay

## 2023-07-22 ENCOUNTER — Emergency Department (HOSPITAL_COMMUNITY)
Admission: EM | Admit: 2023-07-22 | Discharge: 2023-07-22 | Disposition: A | Discharge status: Home / Self Care | Payer: Medicare Other | Attending: Emergency Medicine | Admitting: Emergency Medicine

## 2023-07-22 ENCOUNTER — Other Ambulatory Visit: Payer: Self-pay

## 2023-07-22 ENCOUNTER — Emergency Department (HOSPITAL_COMMUNITY): Payer: Medicare Other

## 2023-07-22 DIAGNOSIS — R109 Unspecified abdominal pain: Secondary | ICD-10-CM | POA: Insufficient documentation

## 2023-07-22 DIAGNOSIS — K429 Umbilical hernia without obstruction or gangrene: Secondary | ICD-10-CM | POA: Diagnosis not present

## 2023-07-22 DIAGNOSIS — E119 Type 2 diabetes mellitus without complications: Secondary | ICD-10-CM | POA: Insufficient documentation

## 2023-07-22 DIAGNOSIS — K449 Diaphragmatic hernia without obstruction or gangrene: Secondary | ICD-10-CM | POA: Diagnosis not present

## 2023-07-22 DIAGNOSIS — Z85118 Personal history of other malignant neoplasm of bronchus and lung: Secondary | ICD-10-CM | POA: Diagnosis not present

## 2023-07-22 DIAGNOSIS — A084 Viral intestinal infection, unspecified: Secondary | ICD-10-CM | POA: Diagnosis not present

## 2023-07-22 DIAGNOSIS — R112 Nausea with vomiting, unspecified: Secondary | ICD-10-CM | POA: Insufficient documentation

## 2023-07-22 DIAGNOSIS — Z7984 Long term (current) use of oral hypoglycemic drugs: Secondary | ICD-10-CM | POA: Insufficient documentation

## 2023-07-22 DIAGNOSIS — R1013 Epigastric pain: Secondary | ICD-10-CM | POA: Diagnosis not present

## 2023-07-22 LAB — CBC WITH DIFFERENTIAL/PLATELET
Abs Immature Granulocytes: 0.04 10*3/uL (ref 0.00–0.07)
Basophils Absolute: 0 10*3/uL (ref 0.0–0.1)
Basophils Relative: 0 %
Eosinophils Absolute: 0.2 10*3/uL (ref 0.0–0.5)
Eosinophils Relative: 2 %
HCT: 38.7 % (ref 36.0–46.0)
Hemoglobin: 12 g/dL (ref 12.0–15.0)
Immature Granulocytes: 1 %
Lymphocytes Relative: 10 %
Lymphs Abs: 0.8 10*3/uL (ref 0.7–4.0)
MCH: 28.4 pg (ref 26.0–34.0)
MCHC: 31 g/dL (ref 30.0–36.0)
MCV: 91.7 fL (ref 80.0–100.0)
Monocytes Absolute: 0.4 10*3/uL (ref 0.1–1.0)
Monocytes Relative: 5 %
Neutro Abs: 6.7 10*3/uL (ref 1.7–7.7)
Neutrophils Relative %: 82 %
Platelets: 221 10*3/uL (ref 150–400)
RBC: 4.22 MIL/uL (ref 3.87–5.11)
RDW: 13.7 % (ref 11.5–15.5)
WBC: 8.1 10*3/uL (ref 4.0–10.5)
nRBC: 0 % (ref 0.0–0.2)

## 2023-07-22 LAB — URINALYSIS, W/ REFLEX TO CULTURE (INFECTION SUSPECTED)
Bilirubin Urine: NEGATIVE
Glucose, UA: NEGATIVE mg/dL
Hgb urine dipstick: NEGATIVE
Ketones, ur: NEGATIVE mg/dL
Leukocytes,Ua: NEGATIVE
Nitrite: NEGATIVE
Protein, ur: NEGATIVE mg/dL
Specific Gravity, Urine: 1.032 — ABNORMAL HIGH (ref 1.005–1.030)
pH: 6 (ref 5.0–8.0)

## 2023-07-22 LAB — COMPREHENSIVE METABOLIC PANEL
ALT: 17 U/L (ref 0–44)
AST: 24 U/L (ref 15–41)
Albumin: 3.6 g/dL (ref 3.5–5.0)
Alkaline Phosphatase: 162 U/L — ABNORMAL HIGH (ref 38–126)
Anion gap: 10 (ref 5–15)
BUN: 24 mg/dL — ABNORMAL HIGH (ref 8–23)
CO2: 25 mmol/L (ref 22–32)
Calcium: 9.5 mg/dL (ref 8.9–10.3)
Chloride: 103 mmol/L (ref 98–111)
Creatinine, Ser: 1.24 mg/dL — ABNORMAL HIGH (ref 0.44–1.00)
GFR, Estimated: 45 mL/min — ABNORMAL LOW (ref >60)
Glucose, Bld: 161 mg/dL — ABNORMAL HIGH (ref 70–99)
Potassium: 4.3 mmol/L (ref 3.5–5.1)
Sodium: 138 mmol/L (ref 135–145)
Total Bilirubin: 0.6 mg/dL (ref <1.2)
Total Protein: 7.2 g/dL (ref 6.5–8.1)

## 2023-07-22 LAB — I-STAT CG4 LACTIC ACID, ED: Lactic Acid, Venous: 1.1 mmol/L (ref 0.5–1.9)

## 2023-07-22 LAB — LIPASE, BLOOD: Lipase: 25 U/L (ref 11–51)

## 2023-07-22 MED ORDER — ONDANSETRON 4 MG PO TBDP: 4.0000 mg | ORAL_TABLET | Freq: Three times a day (TID) | ORAL | 0 refills | Status: AC | PRN

## 2023-07-22 MED ORDER — ALUM & MAG HYDROXIDE-SIMETH 200-200-20 MG/5ML PO SUSP
30.0000 mL | Freq: Once | ORAL | Status: AC
  Administered 2023-07-22: 30 mL via ORAL
  Filled 2023-07-22: qty 30

## 2023-07-22 MED ORDER — LIDOCAINE VISCOUS HCL 2 % MT SOLN
15.0000 mL | Freq: Once | OROMUCOSAL | Status: AC
  Administered 2023-07-22: 15 mL via ORAL
  Filled 2023-07-22: qty 15

## 2023-07-22 MED ORDER — SODIUM CHLORIDE 0.9 % IV BOLUS
1000.0000 mL | Freq: Once | INTRAVENOUS | Status: AC
  Administered 2023-07-22: 1000 mL via INTRAVENOUS

## 2023-07-22 MED ORDER — ONDANSETRON HCL 4 MG/2ML IJ SOLN
4.0000 mg | Freq: Once | INTRAMUSCULAR | Status: AC
  Administered 2023-07-22: 4 mg via INTRAVENOUS
  Filled 2023-07-22: qty 2

## 2023-07-22 MED ORDER — FENTANYL CITRATE PF 50 MCG/ML IJ SOSY
50.0000 ug | PREFILLED_SYRINGE | Freq: Once | INTRAMUSCULAR | Status: AC
  Administered 2023-07-22: 50 ug via INTRAVENOUS
  Filled 2023-07-22: qty 1

## 2023-07-22 MED ORDER — IOHEXOL 350 MG/ML SOLN
75.0000 mL | Freq: Once | INTRAVENOUS | Status: AC | PRN
  Administered 2023-07-22: 75 mL via INTRAVENOUS

## 2023-07-22 NOTE — ED Notes (Signed)
Pt ambulatory to bathroom

## 2023-07-22 NOTE — ED Provider Notes (Signed)
[place] EMERGENCY DEPARTMENT AT [facility] Provider Note   CSN: [id number] Arrival date & time: 07/22/23  5643     History  Chief Complaint  Patient presents with   Abdominal Pain   Emesis   Nausea   Diarrhea    Kelly Herman is a 77 y.o. female.  The history is provided by the patient, the spouse and medical records. No language interpreter was used.  Abdominal Pain Pain quality: aching, cramping and sharp   Pain radiates to:  Does not radiate Pain severity:  Severe Duration:  1 day Timing:  Constant Progression:  Waxing and waning Chronicity:  New Context: not suspicious food intake and not trauma   Relieved by:  Nothing Worsened by:  Palpation Ineffective treatments:  None tried Associated symptoms: cough (chronic and unchanged per pt), diarrhea, nausea and vomiting   Associated symptoms: no chest pain, no chills, no constipation, no dysuria, no fever and no shortness of breath   Emesis Associated symptoms: abdominal pain, cough (chronic and unchanged per pt) and diarrhea   Associated symptoms: no chills, no fever and no headaches   Diarrhea Associated symptoms: abdominal pain and vomiting   Associated symptoms: no chills, no fever and no headaches        Home Medications Prior to Admission medications   Medication Sig Start Date End Date Taking? Authorizing Provider  atorvastatin (LIPITOR) 20 MG tablet Take 20 mg by mouth daily. 12/08/21   [provider]  BREZTRI AEROSPHERE 160-9-4.8 MCG/ACT AERO Inhale 2 puffs into the lungs 2 (two) times daily. 05/04/23   [provider]  celecoxib (CELEBREX) 200 MG capsule Take 200 mg by mouth 2 (two) times daily. 06/15/23   [provider]  esomeprazole (NEXIUM) 40 MG capsule Take 40 mg by mouth daily before breakfast.    [provider]  FLUoxetine (PROZAC) 20 MG capsule Take 20 mg by mouth daily. 01/25/22   [provider]  glipiZIDE (GLUCOTROL) 5 MG tablet Take  5 mg by mouth 2 (two) times daily.    [provider]  LORazepam (ATIVAN) 0.5 MG tablet Take 0.5 mg by mouth daily as needed for anxiety. 01/24/22   [provider]  losartan (COZAAR) 50 MG tablet Take 50 mg by mouth daily. 11/25/21   [provider]  meclizine (ANTIVERT) 12.5 MG tablet Take 1 tablet (12.5 mg total) by mouth 3 (three) times daily as needed for dizziness. 06/12/23   Wynonia Lawman A, NP  ondansetron (ZOFRAN-ODT) 4 MG disintegrating tablet Take 1 tablet (4 mg total) by mouth every 8 (eight) hours as needed for nausea or vomiting. 06/12/23   Wynonia Lawman A, NP  pioglitazone (ACTOS) 30 MG tablet Take 30 mg by mouth daily.    [provider]      Allergies    Angiotensin receptor blockers, Metformin and related, Nsaids, and Tramadol    Review of Systems   Review of Systems  Constitutional:  Negative for chills and fever.  HENT:  Negative for congestion.   Respiratory:  Positive for cough (chronic and unchanged per pt). Negative for chest tightness, shortness of breath and wheezing.   Cardiovascular:  Negative for chest pain.  Gastrointestinal:  Positive for abdominal pain, diarrhea, nausea and vomiting. Negative for constipation.  Genitourinary:  Negative for dysuria, flank pain and frequency.  Musculoskeletal:  Negative for back pain and neck pain.  Skin:  Negative for rash and wound.  Neurological:  Negative for headaches.  Psychiatric/Behavioral:  Negative for agitation.   All other systems reviewed and are negative.   Physical Exam Updated Vital Signs There were no vitals taken for this visit. Physical Exam Vitals and nursing note reviewed.  Constitutional:      General: She is not in acute distress.    Appearance: She is well-developed. She is not ill-appearing, toxic-appearing or diaphoretic.  HENT:     Head: Normocephalic and atraumatic.     Right Ear: External ear normal.     Left Ear: External ear normal.     Nose:  Nose normal.     Mouth/Throat:     Mouth: Mucous membranes are dry.     Pharynx: No oropharyngeal exudate or posterior oropharyngeal erythema.  Eyes:     Extraocular Movements: Extraocular movements intact.     Conjunctiva/sclera: Conjunctivae normal.     Pupils: Pupils are equal, round, and reactive to light.  Cardiovascular:     Rate and Rhythm: Normal rate.     Heart sounds: No murmur heard. Pulmonary:     Effort: No respiratory distress.     Breath sounds: No stridor. No wheezing or rhonchi.  Chest:     Chest wall: No tenderness.  Abdominal:     General: Abdomen is flat. There is no distension.     Tenderness: There is abdominal tenderness. There is no right CVA tenderness, left CVA tenderness, guarding or rebound.  Musculoskeletal:        General: No tenderness.     Cervical back: Normal range of motion and neck supple.     Right lower leg: No edema.     Left lower leg: No edema.  Skin:    General: Skin is warm.     Findings: No erythema or rash.  Neurological:     General: No focal deficit present.     Mental Status: She is alert and oriented to person, place, and time.     Sensory: No sensory deficit.     Motor: No weakness or abnormal muscle tone.     Deep Tendon Reflexes: Reflexes are normal and symmetric.  Psychiatric:        Mood and Affect: Mood normal.     ED Results / Procedures / Treatments   Labs (all labs ordered are listed, but only abnormal results are displayed) Labs Reviewed  COMPREHENSIVE METABOLIC PANEL - Abnormal; Notable for the following components:      Result Value   Glucose, Bld 161 (*)    BUN 24 (*)    Creatinine, Ser 1.24 (*)    Alkaline Phosphatase 162 (*)    GFR, Estimated 45 (*)    All other components within normal limits  URINALYSIS, W/ REFLEX TO CULTURE (INFECTION SUSPECTED) - Abnormal; Notable for the following components:   Specific Gravity, Urine 1.032 (*)    Bacteria, UA RARE (*)    All other components within normal limits   CBC WITH DIFFERENTIAL/PLATELET  LIPASE, BLOOD  I-STAT CG4 LACTIC ACID, ED    EKG None  Radiology CT ABDOMEN PELVIS W CONTRAST  Result Date: 07/22/2023 CLINICAL DATA:  Abdominal pain, acute, nonlocalized EXAM: CT ABDOMEN AND PELVIS WITH CONTRAST TECHNIQUE: Multidetector CT imaging of the abdomen and pelvis was performed using the standard protocol following bolus administration of intravenous contrast. RADIATION DOSE REDUCTION: This exam was performed according to the departmental dose-optimization program which includes automated exposure control, adjustment of the mA and/or kV according to patient size and/or use of iterative reconstruction technique. CONTRAST:  75mL  OMNIPAQUE IOHEXOL 350 MG/ML SOLN COMPARISON:  CT scan abdomen and pelvis from 02/06/2022. FINDINGS: Lower chest: There are patchy atelectatic changes in the visualized lung bases. No overt consolidation. No pleural effusion. The heart is normal in size. No pericardial effusion. Hepatobiliary: The liver is normal in size. Non-cirrhotic configuration. No suspicious mass. No intrahepatic or extrahepatic bile duct dilation. Anatomic variant of Phrygian cap noted. No calcified gallstones. Normal gallbladder wall thickness. No pericholecystic inflammatory changes. 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 No suspicious pancreatic lesion. No pancreatic ductal dilatation or surrounding inflammatory changes. Spleen: Within normal limits. No focal lesion. Adrenals/Urinary Tract: Stable indeterminate left adrenal nodule. Unremarkable right adrenal gland. No suspicious renal mass. No hydronephrosis. No renal or ureteric calculi. Unremarkable urinary bladder. Stomach/Bowel: There is a small-to-moderate sliding hiatal hernia. No disproportionate  dilation of the small or large bowel loops. No evidence of abnormal bowel wall thickening or inflammatory changes. The appendix is unremarkable. Vascular/Lymphatic: No ascites or pneumoperitoneum. No abdominal or pelvic lymphadenopathy, by size criteria. No aneurysmal dilation of the major abdominal arteries. There are moderate peripheral atherosclerotic vascular calcifications of the aorta and its major branches. Reproductive: Normal-size anteverted uterus. 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 Redemonstration of thickened endometrium measuring up to 1.2 cm, incompletely characterized on the current examination but grossly similar to the prior study. Correlate clinically to determine the need for additional imaging with non emergent pelvic ultrasound. Other: There is a small fat containing umbilical hernia. The soft tissues and abdominal wall are otherwise unremarkable. Musculoskeletal: No suspicious osseous lesions. There are mild multilevel degenerative changes in the visualized spine. IMPRESSION: 1. No acute inflammatory process identified within the abdomen or pelvis. 2. There is thickened endometrium measuring up to 1.2 cm, characterized on the current examination but grossly similar to the prior study. Correlate clinically to determine the need for additional imaging with nonemergent pelvic ultrasound. 3. Multiple other nonacute observations, as described above. Electronically Signed   By: Jules Schick M.D.   On: 07/22/2023 15:08    Procedures Procedures    Medications Ordered in ED Medications  sodium chloride 0.9 % bolus 1,000 mL (0 mLs Intravenous Stopped 07/22/23 1002)  fentaNYL (SUBLIMAZE) injection 50 mcg (50 mcg Intravenous Given 07/22/23 0854)  alum & mag hydroxide-simeth (MAALOX/MYLANTA) 200-200-20 MG/5ML suspension 30 mL (30 mLs Oral Given 07/22/23 0850)    And  lidocaine (XYLOCAINE)  2 % viscous mouth solution 15 mL (15 mLs Oral Given 07/22/23 0851)  ondansetron (ZOFRAN) injection 4 mg (4 mg Intravenous Given 07/22/23 0849)  iohexol (OMNIPAQUE) 350 MG/ML injection 75 mL (75 mLs Intravenous Contrast Given 07/22/23 1111)  fentaNYL (SUBLIMAZE) injection 50 mcg (50 mcg Intravenous Given 07/22/23 1527)  ondansetron (ZOFRAN) injection 4 mg (4 mg Intravenous Given 07/22/23 1526)    ED Course/ Medical Decision Making/ A&P                                 Medical Decision Making Amount and/or Complexity of Data Reviewed Labs: ordered. Radiology: ordered.  Risk OTC drugs. Prescription drug management.    Kelly Herman is a 77 y.o. female with a past medical history significant for emphysema with chronic cough, previous gastric  ulcer, GERD, diabetes, previous lung cancer who presents with abdominal pain, nausea, vomiting, and diarrhea.  According to patient, yesterday evening she started having pain across her abdomen and then started having nausea and vomiting overnight.  She also had several episodes of diarrhea.  She reports no blood in the emesis or stool.  Denies any urinary changes.  Denies any fevers or chills.  She reports this is different than discomfort she has had in the past.  She reports she did have a cheeseburger for lunch yesterday and then shrimp in the evening but denies any abnormal tasting food or concern for food poisoning.  No one else with her has been sick.  She still has her gallbladder and has never had any other abdominal surgery.  She reports the pain is up to 10 out of 10 in severity but was an 8 out of 10 now.  She reports no other constipation.  No recent trauma.  No chest pain or shortness of breath.  On exam, lungs clear.  Chest nontender.  Abdomen was tender in the epigastric area and right upper quadrant primarily but was slightly tender otherwise throughout.  Back nontender.  Flanks nontender.  No focal neurologic deficits.  Intact sensation  strength and pulses in extremities.  I did not appreciate a murmur.  Patient otherwise well-appearing.  Dry mucous membranes.  Clinically suspect some dehydration with all the nausea, vomiting, diarrhea overnight but with her tenderness I do feel need to get imaging to rule out acute cholecystitis versus atypical diverticulitis versus other cause of symptoms.  We discussed this could be a gastroenteritis causing her symptoms but need to rule out other concerning etiologies first.  Will give her some pain medicine, nausea medicine, fluids, and a GI cocktail and check the other workup.  If workup reassuring, anticipate discharge home.  Labs and imaging returned reassuring.  Suspect viral gastroenteritis versus food poisoning.  Patient will get another dose of medicine and p.o. challenge.  Anticipate discharge home with prescription for nausea medicine if she tolerates p.o.  Patient and family are in agreement with plan.  She will follow-up with her primary doctor for some of the abnormalities on the CT scan regards to the uterus.  She agreed.    Anticipate discharge shortly.          Final Clinical Impression(s) / ED Diagnoses Final diagnoses:  Abdominal pain, unspecified abdominal location  Nausea vomiting and diarrhea     Clinical Impression: 1. Abdominal pain, unspecified abdominal location   2. Nausea vomiting and diarrhea     Disposition: Admit  This note was prepared with assistance of Dragon voice recognition software. Occasional wrong-word or sound-a-like substitutions may have occurred due to the inherent limitations of voice recognition software.     Gracee Ratterree, Canary Brim, MD 07/22/23 [phone]

## 2023-07-22 NOTE — ED Notes (Signed)
Pt's family notified that the Pt could not eat while waiting on results.  They asked for a timeline for going home.  Pt's family told that information would not be available until CT resulted and that their nurse would be available shortly.

## 2023-07-22 NOTE — ED Triage Notes (Signed)
Pt. Stated, I started having stomach pain with N/V/D since last night.

## 2023-07-22 NOTE — Discharge Instructions (Signed)
Your workup today was overall reassuring.  I suspect you were slightly dehydrated related to the nausea, vomiting, and diarrhea since last night.  The imaging did not show evidence of acute surgical problem.  It did show some thickening in your uterus which we discussed and you need to follow-up with your PCP to discuss if you need outpatient ultrasound to further evaluate.  Please rest and stay hydrated.  Please use the prescription for nausea medicine to help with symptoms.  If any symptoms change or worsen acutely, please return to the nearest emergency department.

## 2023-07-22 NOTE — ED Provider Notes (Signed)
Patient signed out to me by previous provider. Please refer to their note for full HPI.  Briefly this is a 77 year old female who presented to the emergency department with abdominal pain, nausea/vomiting.  Blood work is reassuring, she is pending CT of the abdomen pelvis.  This imaging result does not show any acute finding in regards to the above complaints.  Patient is complaining of ongoing symptoms requesting 1 more round of medicine.  After this the patient will have a p.o. challenge.  Patient passed her p.o. challenge and has been able to walk to the bathroom.  She will be sent home with medicine for symptomatic control and plan for outpatient follow-up.  Patient at this time appears safe and stable for discharge and close outpatient follow up. Discharge plan and strict return to ED precautions discussed, patient verbalizes understanding and agreement.   Rozelle Logan, DO 07/22/23 1750

## 2023-07-28 DIAGNOSIS — R9389 Abnormal findings on diagnostic imaging of other specified body structures: Secondary | ICD-10-CM | POA: Diagnosis not present

## 2023-07-28 DIAGNOSIS — J309 Allergic rhinitis, unspecified: Secondary | ICD-10-CM | POA: Diagnosis not present

## 2023-07-28 DIAGNOSIS — B349 Viral infection, unspecified: Secondary | ICD-10-CM | POA: Diagnosis not present

## 2023-07-28 DIAGNOSIS — E7800 Pure hypercholesterolemia, unspecified: Secondary | ICD-10-CM | POA: Diagnosis not present

## 2023-07-28 DIAGNOSIS — M519 Unspecified thoracic, thoracolumbar and lumbosacral intervertebral disc disorder: Secondary | ICD-10-CM | POA: Diagnosis not present

## 2023-07-28 DIAGNOSIS — J449 Chronic obstructive pulmonary disease, unspecified: Secondary | ICD-10-CM | POA: Diagnosis not present

## 2023-08-03 DIAGNOSIS — E538 Deficiency of other specified B group vitamins: Secondary | ICD-10-CM | POA: Diagnosis not present

## 2023-08-05 ENCOUNTER — Encounter (INDEPENDENT_AMBULATORY_CARE_PROVIDER_SITE_OTHER): Payer: Self-pay | Admitting: Otolaryngology

## 2023-08-10 DIAGNOSIS — R9389 Abnormal findings on diagnostic imaging of other specified body structures: Secondary | ICD-10-CM | POA: Diagnosis not present

## 2023-08-10 DIAGNOSIS — Z85118 Personal history of other malignant neoplasm of bronchus and lung: Secondary | ICD-10-CM | POA: Diagnosis not present

## 2023-09-04 ENCOUNTER — Encounter (INDEPENDENT_AMBULATORY_CARE_PROVIDER_SITE_OTHER): Payer: Self-pay | Admitting: Otolaryngology

## 2023-09-04 ENCOUNTER — Ambulatory Visit (INDEPENDENT_AMBULATORY_CARE_PROVIDER_SITE_OTHER): Payer: Medicare Other | Admitting: Otolaryngology

## 2023-09-04 VITALS — BP 147/72 | HR 99 | Ht 66.0 in | Wt 179.0 lb

## 2023-09-04 DIAGNOSIS — J343 Hypertrophy of nasal turbinates: Secondary | ICD-10-CM | POA: Diagnosis not present

## 2023-09-04 DIAGNOSIS — J342 Deviated nasal septum: Secondary | ICD-10-CM

## 2023-09-04 DIAGNOSIS — J3089 Other allergic rhinitis: Secondary | ICD-10-CM | POA: Diagnosis not present

## 2023-09-04 DIAGNOSIS — R0981 Nasal congestion: Secondary | ICD-10-CM | POA: Diagnosis not present

## 2023-09-04 DIAGNOSIS — R0982 Postnasal drip: Secondary | ICD-10-CM | POA: Diagnosis not present

## 2023-09-04 MED ORDER — CETIRIZINE HCL 10 MG PO TABS: 10.0000 mg | ORAL_TABLET | Freq: Every day | ORAL | 11 refills | Status: DC

## 2023-09-04 MED ORDER — CETIRIZINE HCL 10 MG PO TABS: 10.0000 mg | ORAL_TABLET | Freq: Every day | ORAL | 11 refills | Status: AC

## 2023-09-04 MED ORDER — FLUTICASONE PROPIONATE 50 MCG/ACT NA SUSP: 2.0000 | Freq: Two times a day (BID) | NASAL | 6 refills | Status: AC

## 2023-09-04 NOTE — Progress Notes (Signed)
 ENT CONSULT:  Reason for Consult: post-nasal drainage and chronic nasal congestion   HPI: Discussed the use of AI scribe software for clinical note transcription with the patient, who gave verbal consent to proceed.  History of Present Illness   The patient is a 78 yoF, with a history of chronic nasal congestion and postnasal drainage, was referred for evaluation after a recent ER visit for a viral illness. They report a longstanding issue with postnasal drainage, leading to frequent coughing. They deny any associated voice changes. The patient also mentions symptoms of itchy, watery eyes. They are uncertain about any previous allergy testing.  The patient has been using a nasal spray, possibly Flonase , once daily, but not consistently. They deny any history of sinus infections requiring antibiotics. They also report a recent cold, which has since resolved.  The patient has a history of smoking, but quit a long time ago. They are currently taking cetirizine , but not on a regular basis. They deny any sleep issues or use of CPAP for sleep apnea.    Records Reviewed:  Urgent Care visit 06/23/23 Has had a hoarse voice for greater than 1 year, she is concerned that she may have cancer.  Had lung cancer in the past, has a diagnosis of COPD which causes chronic cough.  Had recent illness 2 weeks ago seen here 06/12/2023 for cough and congestion tested negative for COVID. Denies chest pain, shortness of breath, change in weight, sore throat, swollen glands, ear pain  78 year old former smoker presents with concerns due to hoarse voice for greater than 1 year.  Has hoarse voice otherwise exam within normal limits.  Recommend follow-up with ENT.     Past Medical History:  Diagnosis Date   Allergy    Cancer (HCC)    Diabetes mellitus without complication (HCC)    Emphysema of lung (HCC)    GERD (gastroesophageal reflux disease)    Ulcer     Past Surgical History:  Procedure Laterality Date    LUNG SURGERY      History reviewed. No pertinent family history.  Social History:  reports that she quit smoking about 17 years ago. Her smoking use included cigarettes. She does not have any smokeless tobacco history on file. She reports that she does not drink alcohol and does not use drugs.  Allergies:  Allergies  Allergen Reactions   Angiotensin Receptor Blockers    Metformin And Related Diarrhea   Nsaids     Hx PUD   Tramadol     Medications: I have reviewed the patient's current medications.  The PMH, PSH, Medications, Allergies, and SH were reviewed and updated.  ROS: Constitutional: Negative for fever, weight loss and weight gain. Cardiovascular: Negative for chest pain and dyspnea on exertion. Respiratory: Is not experiencing shortness of breath at rest. Gastrointestinal: Negative for nausea and vomiting. Neurological: Negative for headaches. Psychiatric: The patient is not nervous/anxious  Blood pressure (!) 147/72, pulse 99, height 5' 6 (1.676 m), weight 179 lb (81.2 kg), SpO2 94%.  PHYSICAL EXAM:  Exam: General: Well-developed, well-nourished Communication and Voice: Clear pitch and clarity Respiratory Respiratory effort: Equal inspiration and expiration without stridor Cardiovascular Peripheral Vascular: Warm extremities with equal color/perfusion Eyes: No nystagmus with equal extraocular motion bilaterally Neuro/Psych/Balance: Patient oriented to person, place, and time; Appropriate mood and affect; Gait is intact with no imbalance; Cranial nerves I-XII are intact Head and Face Inspection: Normocephalic and atraumatic without mass or lesion Palpation: Facial skeleton intact without bony stepoffs Salivary Glands: No  mass or tenderness Facial Strength: Facial motility symmetric and full bilaterally ENT Pinna: External ear intact and fully developed External canal: Canal is patent with intact skin Tympanic Membrane: Clear and mobile External Nose: No  scar or anatomic deformity Internal Nose: Septum is deviated to the left. No polyp, or purulence. Mucosal edema and erythema present.  Bilateral inferior turbinate hypertrophy.  Lips, Teeth, and gums: Mucosa and teeth intact and viable TMJ: No pain to palpation with full mobility Oral cavity/oropharynx: No erythema or exudate, no lesions present Nasopharynx: No mass or lesion with intact mucosa Hypopharynx: Intact mucosa without pooling of secretions Larynx Glottic: Full true vocal cord mobility without lesion or mass Supraglottic: Normal appearing epiglottis and AE folds Interarytenoid Space: Moderate pachydermia&edema Subglottic Space: Patent without lesion or edema Neck Neck and Trachea: Midline trachea without mass or lesion Thyroid : No mass or nodularity Lymphatics: No lymphadenopathy  Procedure:    PROCEDURE NOTE: nasal endoscopy  Preoperative diagnosis: chronic sinusitis symptoms  Postoperative diagnosis: same  Procedure: Diagnostic nasal endoscopy ([id number])  Surgeon: Elena Larry, M.D.  Anesthesia: Topical lidocaine  and Afrin  H&P REVIEW: The patient's history and physical were reviewed today prior to procedure. All medications were reviewed and updated as well. Complications: None Condition is stable throughout exam Indications and consent: The patient presents with symptoms of chronic sinusitis not responding to previous therapies. All the risks, benefits, and potential complications were reviewed with the patient preoperatively and informed consent was obtained. The time out was completed with confirmation of the correct procedure.   Procedure: The patient was seated upright in the clinic. Topical lidocaine  and Afrin were applied to the nasal cavity. After adequate anesthesia had occurred, the rigid nasal endoscope was passed into the nasal cavity. The nasal mucosa, turbinates, septum, and sinus drainage pathways were visualized bilaterally. This revealed no  purulence or significant secretions that might be cultured. There were no polyps or sites of significant inflammation. The mucosa was intact and there was no crusting present. The scope was then slowly withdrawn and the patient tolerated the procedure well. There were no complications or blood loss.     Studies Reviewed: 04/27/23 CXR FINDINGS: The heart size and mediastinal contours are within normal limits. Lucencies upper lobe suggesting underlying bullous disease. No pneumothorax or pleural effusion. There are thoracic degenerative changes. Postop changes left hilum.   IMPRESSION: COPD.  Left hilar postop changes.  No acute cardiopulmonary disease.  Assessment/Plan: Encounter Diagnoses  Name Primary?   Chronic nasal congestion Yes   Post-nasal drip    Environmental and seasonal allergies    Nasal septal deviation    Hypertrophy of both inferior nasal turbinates     Assessment and Plan    Nasal Congestion with Postnasal Drainage Chronic nasal congestion and postnasal drainage without voice changes. Nasal endoscopy showed a deviated septum but no masses or polyps and no mucopus. Symptoms include nasal obstruction and frequent postnasal drip causing coughing. Non-adherence to prescribed medications noted. Emphasized the importance of consistent medication use for symptom relief and evaluation of treatment efficacy. - Prescribe cetirizine  10 mg, one tablet daily for 30 days - Prescribe Flonase  nasal spray, two sprays in each nostril twice daily - Recommend saline nasal rinses - Provide written instructions for medication usage  Allergic Rhinitis Symptoms include watery and itchy eyes. No formal allergy testing performed. Inconsistent use of Flonase  and cetirizine . Discussed potential benefits of allergy testing to identify specific allergens and emphasized the importance of daily medication use for effective symptom management. -  Prescribe cetirizine  10 mg, one tablet daily for 30  days - Prescribe Flonase  nasal spray, two sprays in each nostril twice daily - Recommend saline nasal rinses - Provide written instructions for medication usage.     Thank you for allowing me to participate in the care of this patient. Please do not hesitate to contact me with any questions or concerns.   Elena Larry, MD Otolaryngology [facility] Phone: [phone] Fax: [phone]    09/04/2023, 11:35 AM

## 2023-09-04 NOTE — Patient Instructions (Addendum)
-   take Zyrtec  once a day at night - start Flonase  both sides of your nose 2 puffs twice a day - rinse your nose with saline at least once daily   [facility] Med Nasal Saline Rinse   - start nasal saline rinses with [employer] Bottle available over the counter or online to help with nasal congestion

## 2023-09-09 DIAGNOSIS — E538 Deficiency of other specified B group vitamins: Secondary | ICD-10-CM | POA: Diagnosis not present

## 2023-09-10 DIAGNOSIS — M5442 Lumbago with sciatica, left side: Secondary | ICD-10-CM | POA: Diagnosis not present

## 2023-09-10 DIAGNOSIS — M5441 Lumbago with sciatica, right side: Secondary | ICD-10-CM | POA: Diagnosis not present

## 2023-09-16 DIAGNOSIS — M48061 Spinal stenosis, lumbar region without neurogenic claudication: Secondary | ICD-10-CM | POA: Diagnosis not present

## 2023-09-16 DIAGNOSIS — M6281 Muscle weakness (generalized): Secondary | ICD-10-CM | POA: Diagnosis not present

## 2023-09-17 DIAGNOSIS — M545 Low back pain, unspecified: Secondary | ICD-10-CM | POA: Diagnosis not present

## 2023-09-21 ENCOUNTER — Institutional Professional Consult (permissible substitution) (INDEPENDENT_AMBULATORY_CARE_PROVIDER_SITE_OTHER): Payer: Medicare Other | Admitting: Otolaryngology

## 2023-09-22 DIAGNOSIS — M5441 Lumbago with sciatica, right side: Secondary | ICD-10-CM | POA: Diagnosis not present

## 2023-09-22 DIAGNOSIS — M5442 Lumbago with sciatica, left side: Secondary | ICD-10-CM | POA: Diagnosis not present

## 2023-09-22 DIAGNOSIS — M545 Low back pain, unspecified: Secondary | ICD-10-CM | POA: Diagnosis not present

## 2023-10-12 DIAGNOSIS — M5416 Radiculopathy, lumbar region: Secondary | ICD-10-CM | POA: Diagnosis not present

## 2023-10-14 DIAGNOSIS — M5416 Radiculopathy, lumbar region: Secondary | ICD-10-CM | POA: Diagnosis not present

## 2023-10-26 ENCOUNTER — Other Ambulatory Visit (HOSPITAL_COMMUNITY): Payer: Self-pay | Admitting: Internal Medicine

## 2023-10-26 DIAGNOSIS — N1831 Chronic kidney disease, stage 3a: Secondary | ICD-10-CM | POA: Diagnosis not present

## 2023-10-26 DIAGNOSIS — E782 Mixed hyperlipidemia: Secondary | ICD-10-CM | POA: Diagnosis not present

## 2023-10-26 DIAGNOSIS — K279 Peptic ulcer, site unspecified, unspecified as acute or chronic, without hemorrhage or perforation: Secondary | ICD-10-CM | POA: Diagnosis not present

## 2023-10-26 DIAGNOSIS — R0609 Other forms of dyspnea: Secondary | ICD-10-CM

## 2023-10-26 DIAGNOSIS — G620 Drug-induced polyneuropathy: Secondary | ICD-10-CM | POA: Diagnosis not present

## 2023-10-26 DIAGNOSIS — E1122 Type 2 diabetes mellitus with diabetic chronic kidney disease: Secondary | ICD-10-CM | POA: Diagnosis not present

## 2023-10-26 DIAGNOSIS — I700 Atherosclerosis of aorta: Secondary | ICD-10-CM | POA: Diagnosis not present

## 2023-10-26 DIAGNOSIS — R600 Localized edema: Secondary | ICD-10-CM | POA: Diagnosis not present

## 2023-10-26 DIAGNOSIS — E119 Type 2 diabetes mellitus without complications: Secondary | ICD-10-CM | POA: Diagnosis not present

## 2023-10-26 DIAGNOSIS — J449 Chronic obstructive pulmonary disease, unspecified: Secondary | ICD-10-CM | POA: Diagnosis not present

## 2023-10-26 DIAGNOSIS — E538 Deficiency of other specified B group vitamins: Secondary | ICD-10-CM | POA: Diagnosis not present

## 2023-10-26 DIAGNOSIS — I10 Essential (primary) hypertension: Secondary | ICD-10-CM | POA: Diagnosis not present

## 2023-10-27 ENCOUNTER — Ambulatory Visit (HOSPITAL_COMMUNITY): Payer: Medicare Other | Attending: Internal Medicine

## 2023-10-27 DIAGNOSIS — R0609 Other forms of dyspnea: Secondary | ICD-10-CM | POA: Diagnosis not present

## 2023-10-27 LAB — ECHOCARDIOGRAM COMPLETE
Area-P 1/2: 4.89 cm2
S' Lateral: 2.61 cm

## 2023-11-23 DIAGNOSIS — E538 Deficiency of other specified B group vitamins: Secondary | ICD-10-CM | POA: Diagnosis not present

## 2023-11-23 DIAGNOSIS — J449 Chronic obstructive pulmonary disease, unspecified: Secondary | ICD-10-CM | POA: Diagnosis not present

## 2023-11-23 DIAGNOSIS — N1831 Chronic kidney disease, stage 3a: Secondary | ICD-10-CM | POA: Diagnosis not present

## 2023-11-23 DIAGNOSIS — I10 Essential (primary) hypertension: Secondary | ICD-10-CM | POA: Diagnosis not present

## 2023-11-27 DIAGNOSIS — M25512 Pain in left shoulder: Secondary | ICD-10-CM | POA: Diagnosis not present

## 2024-01-01 DIAGNOSIS — H04123 Dry eye syndrome of bilateral lacrimal glands: Secondary | ICD-10-CM | POA: Diagnosis not present

## 2024-01-01 DIAGNOSIS — H1013 Acute atopic conjunctivitis, bilateral: Secondary | ICD-10-CM | POA: Diagnosis not present

## 2024-01-01 DIAGNOSIS — E119 Type 2 diabetes mellitus without complications: Secondary | ICD-10-CM | POA: Diagnosis not present

## 2024-03-11 DIAGNOSIS — M5416 Radiculopathy, lumbar region: Secondary | ICD-10-CM | POA: Diagnosis not present

## 2024-03-11 DIAGNOSIS — M25512 Pain in left shoulder: Secondary | ICD-10-CM | POA: Diagnosis not present

## 2024-04-12 DIAGNOSIS — Z1231 Encounter for screening mammogram for malignant neoplasm of breast: Secondary | ICD-10-CM | POA: Diagnosis not present

## 2024-04-14 DIAGNOSIS — M5432 Sciatica, left side: Secondary | ICD-10-CM | POA: Diagnosis not present

## 2024-04-14 DIAGNOSIS — M5431 Sciatica, right side: Secondary | ICD-10-CM | POA: Diagnosis not present

## 2024-04-21 DIAGNOSIS — M5432 Sciatica, left side: Secondary | ICD-10-CM | POA: Diagnosis not present

## 2024-04-21 DIAGNOSIS — M5431 Sciatica, right side: Secondary | ICD-10-CM | POA: Diagnosis not present

## 2024-04-28 DIAGNOSIS — M5431 Sciatica, right side: Secondary | ICD-10-CM | POA: Diagnosis not present

## 2024-04-28 DIAGNOSIS — M5432 Sciatica, left side: Secondary | ICD-10-CM | POA: Diagnosis not present

## 2024-04-29 DIAGNOSIS — Z0000 Encounter for general adult medical examination without abnormal findings: Secondary | ICD-10-CM | POA: Diagnosis not present

## 2024-04-29 DIAGNOSIS — Z23 Encounter for immunization: Secondary | ICD-10-CM | POA: Diagnosis not present

## 2024-04-29 DIAGNOSIS — I10 Essential (primary) hypertension: Secondary | ICD-10-CM | POA: Diagnosis not present

## 2024-04-29 DIAGNOSIS — E538 Deficiency of other specified B group vitamins: Secondary | ICD-10-CM | POA: Diagnosis not present

## 2024-04-29 DIAGNOSIS — K219 Gastro-esophageal reflux disease without esophagitis: Secondary | ICD-10-CM | POA: Diagnosis not present

## 2024-04-29 DIAGNOSIS — N1831 Chronic kidney disease, stage 3a: Secondary | ICD-10-CM | POA: Diagnosis not present

## 2024-04-29 DIAGNOSIS — E1122 Type 2 diabetes mellitus with diabetic chronic kidney disease: Secondary | ICD-10-CM | POA: Diagnosis not present

## 2024-04-29 DIAGNOSIS — J449 Chronic obstructive pulmonary disease, unspecified: Secondary | ICD-10-CM | POA: Diagnosis not present

## 2024-04-29 DIAGNOSIS — Z85118 Personal history of other malignant neoplasm of bronchus and lung: Secondary | ICD-10-CM | POA: Diagnosis not present

## 2024-04-29 DIAGNOSIS — E7800 Pure hypercholesterolemia, unspecified: Secondary | ICD-10-CM | POA: Diagnosis not present

## 2024-04-29 DIAGNOSIS — I700 Atherosclerosis of aorta: Secondary | ICD-10-CM | POA: Diagnosis not present

## 2024-05-03 DIAGNOSIS — E1122 Type 2 diabetes mellitus with diabetic chronic kidney disease: Secondary | ICD-10-CM | POA: Diagnosis not present

## 2024-05-05 DIAGNOSIS — M5432 Sciatica, left side: Secondary | ICD-10-CM | POA: Diagnosis not present

## 2024-05-05 DIAGNOSIS — M5431 Sciatica, right side: Secondary | ICD-10-CM | POA: Diagnosis not present

## 2024-05-10 DIAGNOSIS — M5431 Sciatica, right side: Secondary | ICD-10-CM | POA: Diagnosis not present

## 2024-05-10 DIAGNOSIS — M5432 Sciatica, left side: Secondary | ICD-10-CM | POA: Diagnosis not present

## 2024-05-10 DIAGNOSIS — M5416 Radiculopathy, lumbar region: Secondary | ICD-10-CM | POA: Diagnosis not present

## 2024-05-18 DIAGNOSIS — M5431 Sciatica, right side: Secondary | ICD-10-CM | POA: Diagnosis not present

## 2024-05-18 DIAGNOSIS — M5432 Sciatica, left side: Secondary | ICD-10-CM | POA: Diagnosis not present

## 2024-05-20 DIAGNOSIS — M545 Low back pain, unspecified: Secondary | ICD-10-CM | POA: Diagnosis not present

## 2024-05-30 DIAGNOSIS — M5416 Radiculopathy, lumbar region: Secondary | ICD-10-CM | POA: Diagnosis not present

## 2024-06-06 DIAGNOSIS — E782 Mixed hyperlipidemia: Secondary | ICD-10-CM | POA: Diagnosis not present

## 2024-06-13 DIAGNOSIS — Z23 Encounter for immunization: Secondary | ICD-10-CM | POA: Diagnosis not present
